# Patient Record
Sex: Female | Born: 1970 | Race: White | Hispanic: No | Marital: Married | Smoking: Never smoker
Health system: Southern US, Community
[De-identification: ages and names within clinical notes are randomized; demographics above are authoritative.]

## PROBLEM LIST (undated history)

## (undated) DIAGNOSIS — H269 Unspecified cataract: Secondary | ICD-10-CM

## (undated) DIAGNOSIS — E079 Disorder of thyroid, unspecified: Secondary | ICD-10-CM

## (undated) HISTORY — DX: Unspecified cataract: H26.9

## (undated) HISTORY — PX: VEIN SURGERY: SHX48

## (undated) HISTORY — DX: Disorder of thyroid, unspecified: E07.9

---

## 1989-01-09 HISTORY — PX: HYMENECTOMY: SHX987

## 1999-06-28 ENCOUNTER — Other Ambulatory Visit: Admission: RE | Admit: 1999-06-28 | Discharge: 1999-06-28 | Payer: Self-pay | Admitting: Gynecology

## 2001-05-27 ENCOUNTER — Other Ambulatory Visit: Admission: RE | Admit: 2001-05-27 | Discharge: 2001-05-27 | Payer: Self-pay | Admitting: Gynecology

## 2002-04-15 ENCOUNTER — Other Ambulatory Visit: Admission: RE | Admit: 2002-04-15 | Discharge: 2002-04-15 | Payer: Self-pay | Admitting: Gynecology

## 2002-11-11 ENCOUNTER — Encounter (INDEPENDENT_AMBULATORY_CARE_PROVIDER_SITE_OTHER): Payer: Self-pay

## 2002-11-11 ENCOUNTER — Inpatient Hospital Stay (HOSPITAL_COMMUNITY): Admission: AD | Admit: 2002-11-11 | Discharge: 2002-11-13 | Payer: Self-pay | Admitting: Gynecology

## 2002-12-30 ENCOUNTER — Other Ambulatory Visit: Admission: RE | Admit: 2002-12-30 | Discharge: 2002-12-30 | Payer: Self-pay | Admitting: Gynecology

## 2005-05-01 ENCOUNTER — Other Ambulatory Visit: Admission: RE | Admit: 2005-05-01 | Discharge: 2005-05-01 | Payer: Self-pay | Admitting: Gynecology

## 2005-11-24 ENCOUNTER — Inpatient Hospital Stay (HOSPITAL_COMMUNITY): Admission: RE | Admit: 2005-11-24 | Discharge: 2005-11-26 | Payer: Self-pay | Admitting: Gynecology

## 2005-12-25 ENCOUNTER — Other Ambulatory Visit: Admission: RE | Admit: 2005-12-25 | Discharge: 2005-12-25 | Payer: Self-pay | Admitting: Gynecology

## 2007-11-08 ENCOUNTER — Inpatient Hospital Stay (HOSPITAL_COMMUNITY): Admission: RE | Admit: 2007-11-08 | Discharge: 2007-11-10 | Payer: Self-pay | Admitting: Obstetrics and Gynecology

## 2007-11-08 ENCOUNTER — Encounter (INDEPENDENT_AMBULATORY_CARE_PROVIDER_SITE_OTHER): Payer: Self-pay | Admitting: Obstetrics and Gynecology

## 2010-03-15 ENCOUNTER — Encounter: Payer: Self-pay | Admitting: Gynecology

## 2010-03-21 ENCOUNTER — Other Ambulatory Visit: Payer: Self-pay | Admitting: Gynecology

## 2010-03-21 ENCOUNTER — Other Ambulatory Visit (HOSPITAL_COMMUNITY)
Admission: RE | Admit: 2010-03-21 | Discharge: 2010-03-21 | Disposition: A | Discharge status: Home / Self Care | Payer: BC Managed Care – PPO | Source: Ambulatory Visit | Attending: Gynecology | Admitting: Gynecology

## 2010-03-21 ENCOUNTER — Encounter (INDEPENDENT_AMBULATORY_CARE_PROVIDER_SITE_OTHER): Payer: BC Managed Care – PPO | Admitting: Gynecology

## 2010-03-21 DIAGNOSIS — Z01419 Encounter for gynecological examination (general) (routine) without abnormal findings: Secondary | ICD-10-CM

## 2010-03-21 DIAGNOSIS — Z833 Family history of diabetes mellitus: Secondary | ICD-10-CM

## 2010-03-21 DIAGNOSIS — Z13220 Encounter for screening for lipoid disorders: Secondary | ICD-10-CM

## 2010-03-21 DIAGNOSIS — N841 Polyp of cervix uteri: Secondary | ICD-10-CM

## 2010-03-21 DIAGNOSIS — Z124 Encounter for screening for malignant neoplasm of cervix: Secondary | ICD-10-CM | POA: Insufficient documentation

## 2010-05-23 ENCOUNTER — Other Ambulatory Visit: Payer: Self-pay | Admitting: Gynecology

## 2010-05-23 DIAGNOSIS — Z1231 Encounter for screening mammogram for malignant neoplasm of breast: Secondary | ICD-10-CM

## 2010-05-24 NOTE — Discharge Summary (Signed)
NAMEENSLEY, BLAS             ACCOUNT NO.:  [id number]   MEDICAL RECORD NO.:  [id number]          PATIENT TYPE:  INP   LOCATION:  9144                          FACILITY:  [facility]   PHYSICIAN:  Malachi Pro. Ambrose Mantle, M.D. DATE OF BIRTH:  Jun 01, 1970   DATE OF ADMISSION:  11/08/2007  DATE OF DISCHARGE:  11/10/2007                               DISCHARGE SUMMARY   A 40 year old white married female para 3-0-0-3, gravida 4, EDC November 12, 2007, admitted for induction of labor.  Blood group and type A  negative, negative antibody, nonreactive serology, rubella immune,  hepatitis B surface antigen negative, HIV negative, GC and chlamydia  negative.  Group B strep negative, 1-hour Glucola 137, 3-hour GTT 88,  141, 112, and 96.  Declined screening.  Ultrasound on April 02, 2007, 8  weeks 0 days, [facility] November 12, 2007.  Ultrasound June 11, 2007, average  gestational age [redacted] weeks 6 days, [facility] November 13, 2007.  Prenatal care  uncomplicated until size less than dates on October 24, 2007.  Ultrasound average gestational age amniotic fluid volume 15th  percentile.  Nonstress test 2 times a week were reactive.   PAST MEDICAL HISTORY:   ALLERGY:  To CIPRO, caused tingling.   OPERATIONS:  Hymenectomy at age 41.   ILLNESSES:  None.   FAMILY HISTORY:  Family history of Hurler syndrome and Sanfilippo type  3.  She declined testing.  Paternal grandfather with heart disease and  diabetes.  Alcohol, tobacco, and drugs none.   OBSTETRICAL HISTORY:  In 1993, a 7 pounds 5 ounces female at 39 weeks;  2004, 8 pounds 3 ounces female; 2007, 7 pounds 14 ounces female.   PHYSICAL EXAMINATION:  VITAL SIGNS:  On admission the patient's vital  signs were normal.  HEART/LUNGS:  Normal.  ABDOMEN:  Soft, fundal height 35-1/2 cm.  Fetal heart tones normal.  Cervix 3-4 cm, 70% vertex minus 2 to minus 3.  Artificial rupture of  membranes produced clear fluid.   HOSPITAL COURSE:  Pitocin was used.  At 9:25 a.m. the  contractions were  every 2-3 minutes.  Cervix was 6 cm, 100%.  She progressed to full  dilatation and pushed well, but her epidural was ineffective with a  large amount of caput showing.  She was screaming out in severe pain, so  a midline episiotomy was made under local block and the 8 pounds 0  ounces female infant with Apgars of 8 at 1 and 9 at 5 minutes, slit up  easily.  The placenta delivered intact.  The uterus was normal.  Second-  degree midline episiotomy repaired under local block.  Blood loss about  400 mL.  Postpartum, the patient did quite well and was discharged on  the second postpartum day.  Initial hemoglobin 10/8, hematocrit 32,  white count 5900, platelet count 262,000.  Follow up hemoglobin 9.8, RPR  nonreactive.  The baby was Rh negative.  Mother was not a candidate for  RhoGAM.   FINAL DIAGNOSES:  Intrauterine pregnancy at 39+ weeks, delivered vertex.   OPERATIONS:  1. Spontaneous delivery vertex.  2. Repair  of second-degree midline episiotomy.   FINAL CONDITION:  Improved.   DISCHARGE INSTRUCTIONS:  Include a regular discharge instruction  booklet.  Percocet 3/325, 30 tablets one every 4-6 hours as needed for  pain and Motrin 600 mg 30 tablets one every 6 hours as needed for pain.  The patient is advised to return to the office in 6 weeks for follow up  examination.  She is also to take ferrous sulfate 325 mg twice daily.      Malachi Pro. Ambrose Mantle, M.D.  Electronically Signed     TFH/MEDQ  D:  11/10/2007  T:  11/10/2007  Job:  [id number]

## 2010-05-27 NOTE — H&P (Signed)
   NAMEZOEANN, [name]                         ACCOUNT NO.:  [id number]   MEDICAL RECORD NO.:  [id number]                   PATIENT TYPE:  INP   LOCATION:  9168                                 [facility]ACILITY:  [facility]   PHYSICIAN:  Ana P. Fontaine, M.D.           DATE OF BIRTH:  05-09-1970   DATE OF ADMISSION:  11/11/2002  DATE OF DISCHARGE:                                HISTORY & PHYSICAL   CHIEF COMPLAINT:  Pregnancy at term, favorable cervix for induction.   HISTORY OF PRESENT ILLNESS:  A 40 year old G45, P74 female at 22-weeks  gestation with a favorable cervix for elective induction.  Options for  expectant management versus induction were discussed, and she wants to  proceed with induction.   PRENATAL COURSE:  Significant for Rh negative.  Her husband is also Rh  negative.  A maternal history of Hurler's, Sanfilippo syndrome in the family  with a prenatal diagnoses declined and a beta strep negative screen.   For remainder of her history see her Hollister.   EXAM:  HEENT:  Normal.  LUNGS:  Clear.  CARDIAC:  Regular rate.  No rubs, murmurs or gallops.  ABDOMEN:  Gravid, vertex fetus.  External monitor shows a reactive fetal  tracing.  PELVIC:  Cervix 2-cm, 50% effaced, -1 to -2 station.   ASSESSMENT/PLAN:  A 40 year old gravida 2, para 1, female at 40 weeks,  favorable cervix, increasing musculoskeletal discomfort for induction.   We will plan on AROM, Pitocin induction.  She is beta strep negative.  The  plan was discussed with the patient, understood and accepted.                                               [name], M.D.    TPF/MEDQ  D:  11/11/2002  T:  11/11/2002  Job:  [id number]

## 2010-05-27 NOTE — H&P (Signed)
[name], [name]             ACCOUNT NO.:  [id number]   MEDICAL RECORD NO.:  [id number]          PATIENT TYPE:  MAT   LOCATION:  MATC                          [facility]ILITY:  [facility]   PHYSICIAN:  Timothy P. Fontaine, M.D.DATE OF BIRTH:  Jan 13, 1970   DATE OF ADMISSION:  11/24/2005  DATE OF DISCHARGE:                                HISTORY & PHYSICAL   CHIEF COMPLAINT:  Post date pregnancy.   HISTORY OF PRESENT ILLNESS:  A 40 year old G 3, P 2 female at [redacted] weeks  gestation admitted for induction due to post date pregnancy, favorable  cervix.  For remainder of her history, see her Hollister.  Prenatal course  has been uncomplicated to date.  She is O negative although her husband is  also Rh negative and her beta Strep screening culture was negative.   PHYSICAL EXAMINATION:  HEENT:  Normal.  LUNGS:  Clear.  CARDIAC:  Regular rate without rubs, murmurs or gallops.  ABDOMEN:  Gravid, vertex.  Fetus consistent with term.  Positive fetal heart  tones.  PELVIC:  Cervix is 2 cm, 50% effaced, 0 station.   ASSESSMENT/PLAN:  A 40 year old G 3, P 2 female [redacted] weeks gestation,  favorable cervix for induction.  The options of continued expectant  management versus induction were reviewed.  Due to the increasing risk of  stillbirth, the patient wants to proceed with induction.  She is beta Strep  negative.  Will plan on Pitocin induction with artificial rupture of  membranes as discussed, understood and accepted.      Timothy P. Fontaine, M.D.  Electronically Signed     TPF/MEDQ  D:  11/23/2005  T:  11/23/2005  Job:  [id number]

## 2010-05-27 NOTE — Discharge Summary (Signed)
NAMEAERIN, [name]                         ACCOUNT NO.:  [id number]   MEDICAL RECORD NO.:  [id number]                   PATIENT TYPE:  INP   LOCATION:  9121                                 [facility]   PHYSICIAN:  Ivor Costa. Farrel Gobble, M.D.              DATE OF BIRTH:  [birth year]   DATE OF ADMISSION:  11/11/2002  DATE OF DISCHARGE:  11/13/2002                                 DISCHARGE SUMMARY   DISCHARGE DIAGNOSES:  1. Intrauterine pregnancy at 40 weeks, delivered,  2. Status post spontaneous vaginal delivery.  3. Rh negative, but husband Rh negative.   HISTORY:  A 40 year old female gravida 2, para 1 with an EDC of November 10, 2002.  Prenatal course had been complicated by a history of Rh negative but  the patient also Rh negative.  There is family history of Hurlers and  Sanfilippo syndromes but the patient declined any prenatal workup and  declined referral for genetic counseling.   HOSPITAL COURSE:  On November 11, 2002 the patient was admitted at 40+ weeks  for induction, begun on Pitocin successfully on November 11, 2002 at 1140 the  patient underwent a spontaneous vaginal delivery of a female, Apgars of 9  and 9, weight of 8 pounds and 3 ounces.  There was a midline episiotomy  which was repaired.  There was a right clitoral tear which was also  repaired.  Postpartum the patient remained afebrile, voiding, in stable  condition.  She was discharged to home on November 13, 2002 and given  [facility] Gyne[facility] postpartum instructions.   Followup on the labs:  The patient is A negative, rubella immune.  The baby  Rh negative.   DISPOSITION:  The patient was discharged to home.  Given a prescription for  Tylox p.r.n. pain. If she has any problems prior to six weeks' visit is to  be seen in the office.     Susa Loffler, P.A.                    Ivor Costa. Farrel Gobble, M.D.    TSG/MEDQ  D:  12/08/2002  T:  12/08/2002  Job:  [id number]

## 2010-06-21 ENCOUNTER — Ambulatory Visit (HOSPITAL_COMMUNITY)
Admission: RE | Admit: 2010-06-21 | Discharge: 2010-06-21 | Disposition: A | Discharge status: Home / Self Care | Payer: BC Managed Care – PPO | Source: Ambulatory Visit | Attending: Gynecology | Admitting: Gynecology

## 2010-06-21 DIAGNOSIS — Z1231 Encounter for screening mammogram for malignant neoplasm of breast: Secondary | ICD-10-CM

## 2010-06-22 ENCOUNTER — Other Ambulatory Visit: Payer: Self-pay | Admitting: Gynecology

## 2010-06-22 DIAGNOSIS — R928 Other abnormal and inconclusive findings on diagnostic imaging of breast: Secondary | ICD-10-CM

## 2010-06-29 ENCOUNTER — Ambulatory Visit
Admission: RE | Admit: 2010-06-29 | Discharge: 2010-06-29 | Disposition: A | Discharge status: Home / Self Care | Payer: BC Managed Care – PPO | Source: Ambulatory Visit | Attending: Gynecology | Admitting: Gynecology

## 2010-06-29 DIAGNOSIS — R928 Other abnormal and inconclusive findings on diagnostic imaging of breast: Secondary | ICD-10-CM

## 2010-10-10 LAB — CBC
HCT: 29.4 — ABNORMAL LOW
Hemoglobin: 10.8 — ABNORMAL LOW
Hemoglobin: 9.8 — ABNORMAL LOW
MCHC: 32.8
MCV: 86.1
Platelets: 241
RBC: 3.79 — ABNORMAL LOW
WBC: 5.9
WBC: 8

## 2010-10-10 LAB — RPR: RPR Ser Ql: NONREACTIVE

## 2011-04-04 ENCOUNTER — Other Ambulatory Visit: Payer: Self-pay | Admitting: Dermatology

## 2011-06-27 ENCOUNTER — Encounter: Payer: Self-pay | Admitting: *Deleted

## 2011-07-04 ENCOUNTER — Encounter: Payer: Self-pay | Admitting: Gynecology

## 2011-07-04 ENCOUNTER — Ambulatory Visit (INDEPENDENT_AMBULATORY_CARE_PROVIDER_SITE_OTHER): Payer: BC Managed Care – PPO | Admitting: Gynecology

## 2011-07-04 VITALS — BP 112/70 | Ht 66.0 in | Wt 142.0 lb

## 2011-07-04 DIAGNOSIS — Z01419 Encounter for gynecological examination (general) (routine) without abnormal findings: Secondary | ICD-10-CM

## 2011-07-04 NOTE — Progress Notes (Addendum)
[name] 01/12/1970 [id number]        41 y.o.  for annual exam.  Overall doing well.  Past medical history,surgical history, medications, allergies, family history and social history were all reviewed and documented in the EPIC chart. ROS:  Was performed and pertinent positives and negatives are included in the history.  Exam: [name] assistant present Filed Vitals:   07/04/11 1015  BP: 112/70   General appearance  Normal Skin grossly normal Lower extremities without swelling, gross varicosities, good peripheral pulses Head/Neck normal with no cervical or supraclavicular adenopathy thyroid normal Lungs  clear Cardiac RR, without RMG Abdominal  soft, nontender, without masses, organomegaly or hernia Breasts  examined lying and sitting without masses, retractions, discharge or axillary adenopathy.  Bilateral inverted nipples noted. Pelvic  Ext/BUS/vagina  normal   Cervix  normal  Uterus  anteverted, normal size, shape and contour, midline and mobile nontender   Adnexa  Without masses or tenderness    Anus and perineum  normal   Rectovaginal  normal sphincter tone without palpated masses or tenderness.    Assessment/Plan:  41 y.o. female for annual exam, regular menses, vasectomy birth control.    1. Mammography. Patient is due for her mammogram now I reminded her to schedule this. Bilateral inverted nipples that she is always had. Occasional galactorrhea which she is always had since breast-feeding her child 3 years ago. Never bloody. Patient will follow if increases and or does other than milk he she knows to report this. SBE monthly reviewed. 2. Pap smear. Last Pap smear 2012. No Pap smear done today. No history of abnormal Pap smears before. Numerous normal records in her chart. We'll plan every 3-5 year screening. 3. Left leg varicosity. Patient was seen for superficial spider vein treatments and was found to have a deeper varicosity. She is having some leg discomfort with  throbbing particularly with standing and is considering to have this addressed. I certainly think this is reasonable and will provide a supporting letter. Her exam is overall normal with good peripheral pulses and no gross evidence of swelling/for costs of these. 4. Health maintenance. Glucose, lipid profile, CBC, urinalysis all normal last year and this was not repeated. Assuming she continues well from a gynecologic standpoint she will see me in a year, sooner as needed.    Dara Lords MD, 10:47 AM 07/04/2011

## 2011-07-04 NOTE — Patient Instructions (Signed)
Follow up in one year for annual gynecologic exam. 

## 2011-08-10 ENCOUNTER — Other Ambulatory Visit: Payer: Self-pay | Admitting: Gynecology

## 2011-08-10 DIAGNOSIS — Z1231 Encounter for screening mammogram for malignant neoplasm of breast: Secondary | ICD-10-CM

## 2011-09-15 ENCOUNTER — Ambulatory Visit (HOSPITAL_COMMUNITY)
Admission: RE | Admit: 2011-09-15 | Discharge: 2011-09-15 | Disposition: A | Discharge status: Home / Self Care | Payer: BC Managed Care – PPO | Source: Ambulatory Visit | Attending: Gynecology | Admitting: Gynecology

## 2011-09-15 DIAGNOSIS — Z1231 Encounter for screening mammogram for malignant neoplasm of breast: Secondary | ICD-10-CM

## 2012-05-13 ENCOUNTER — Ambulatory Visit: Payer: Self-pay | Admitting: Women's Health

## 2012-06-24 ENCOUNTER — Encounter: Payer: Self-pay | Admitting: Gynecology

## 2012-11-08 ENCOUNTER — Other Ambulatory Visit: Payer: Self-pay | Admitting: Gynecology

## 2012-11-08 DIAGNOSIS — Z1231 Encounter for screening mammogram for malignant neoplasm of breast: Secondary | ICD-10-CM

## 2012-11-26 ENCOUNTER — Ambulatory Visit (HOSPITAL_COMMUNITY)
Admission: RE | Admit: 2012-11-26 | Discharge: 2012-11-26 | Disposition: A | Discharge status: Home / Self Care | Payer: 59 | Source: Ambulatory Visit | Attending: Gynecology | Admitting: Gynecology

## 2012-11-26 ENCOUNTER — Other Ambulatory Visit: Payer: Self-pay | Admitting: Gynecology

## 2012-11-26 DIAGNOSIS — Z1231 Encounter for screening mammogram for malignant neoplasm of breast: Secondary | ICD-10-CM

## 2012-12-11 ENCOUNTER — Encounter: Payer: Self-pay | Admitting: Gynecology

## 2013-01-21 ENCOUNTER — Encounter: Payer: Self-pay | Admitting: Gynecology

## 2013-01-21 ENCOUNTER — Other Ambulatory Visit (HOSPITAL_COMMUNITY)
Admission: RE | Admit: 2013-01-21 | Discharge: 2013-01-21 | Disposition: A | Discharge status: Home / Self Care | Payer: 59 | Source: Ambulatory Visit | Attending: Gynecology | Admitting: Gynecology

## 2013-01-21 ENCOUNTER — Ambulatory Visit (INDEPENDENT_AMBULATORY_CARE_PROVIDER_SITE_OTHER): Payer: 59 | Admitting: Gynecology

## 2013-01-21 VITALS — BP 116/70 | Ht 66.0 in | Wt 145.0 lb

## 2013-01-21 DIAGNOSIS — Z01419 Encounter for gynecological examination (general) (routine) without abnormal findings: Secondary | ICD-10-CM

## 2013-01-21 DIAGNOSIS — Z1151 Encounter for screening for human papillomavirus (HPV): Secondary | ICD-10-CM | POA: Insufficient documentation

## 2013-01-21 DIAGNOSIS — N9089 Other specified noninflammatory disorders of vulva and perineum: Secondary | ICD-10-CM

## 2013-01-21 LAB — WET PREP FOR TRICH, YEAST, CLUE
CLUE CELLS WET PREP: NONE SEEN
TRICH WET PREP: NONE SEEN
WBC, Wet Prep HPF POC: NONE SEEN

## 2013-01-21 MED ORDER — FLUCONAZOLE 150 MG PO TABS: 150.0000 mg | ORAL_TABLET | Freq: Once | ORAL | Status: DC

## 2013-01-21 NOTE — Progress Notes (Signed)
Ana FanningJulie [name] Aug 06, 1970 [id number]        43 y.o.  G4P4 for annual exam.  Doing well. Several issues noted below.  Past medical history,surgical history, problem list, medications, allergies, family history and social history were all reviewed and documented in the EPIC chart.  ROS:  Performed and pertinent positives and negatives are included in the history, assessment and plan .  Exam: Kim assistant Filed Vitals:   01/21/13 1152  BP: 116/70  Height: 5\' 6"  (1.676 m)  Weight: 145 lb (65.772 kg)   General appearance  Normal Skin grossly normal Head/Neck normal with no cervical or supraclavicular adenopathy thyroid normal Lungs  clear Cardiac RR, without RMG Abdominal  soft, nontender, without masses, organomegaly or hernia Breasts  examined lying and sitting without masses, retractions, discharge or axillary adenopathy. Bilateral inverted nipples. Pelvic  Ext/BUS/vagina  Normal  Cervix  Normal Pap/HPV done  Uterus  anteverted, normal size, shape and contour, midline and mobile nontender   Adnexa  Without masses or tenderness    Anus and perineum  Normal   Rectovaginal  Normal sphincter tone without palpated masses or tenderness.    Assessment/Plan:  43 y.o. 714P4 female for annual exam regular menses, vasectomy birth control.   1. Vulvar irritation. History of vulvar irritation. She used OTC antifungal and notes that her symptoms cleared. She asked us to be checked to make sure it is okay. Her exam is normal but she did show few yeast on wet prep. Issues of colonization versus infection reviewed. Will cover with Diflucan 150 mg x1 dose. Followup if symptoms recur. 2. Pap smear 2012. Pap/HPV done today. No history of significant abnormal Pap smears previously. 3. Mammography 11/2012. Continue with annual mammography. SBE monthly reviewed. Bilateral inverted nipples as always. Occasional clear galactorrhea is birth of last child. Never bloody. She will monitor and report any  changes. 4. Health maintenance. Patient reports routine blood work in June 2014. No blood work done today. Followup in one year, sooner as needed.   Note: This document was prepared with digital dictation and possible smart phrase technology. Any transcriptional errors that result from this process are unintentional.   Dara LordsFONTAINE,TIMOTHY P MD, 12:28 PM 01/21/2013

## 2013-01-21 NOTE — Patient Instructions (Signed)
Take Diflucan pill once. Followup in one year for annual exam.

## 2013-01-21 NOTE — Addendum Note (Signed)
Addended by: Dayna BarkerGARDNER, Haidyn Chadderdon K on: 01/21/2013 12:39 PM   Modules accepted: Orders

## 2013-01-22 LAB — URINALYSIS W MICROSCOPIC + REFLEX CULTURE
Bacteria, UA: NONE SEEN
Bilirubin Urine: NEGATIVE
Casts: NONE SEEN
Crystals: NONE SEEN
Glucose, UA: NEGATIVE mg/dL
Hgb urine dipstick: NEGATIVE
Ketones, ur: NEGATIVE mg/dL
LEUKOCYTES UA: NEGATIVE
NITRITE: NEGATIVE
PROTEIN: NEGATIVE mg/dL
SQUAMOUS EPITHELIAL / LPF: NONE SEEN
Specific Gravity, Urine: 1.012 (ref 1.005–1.030)
UROBILINOGEN UA: 0.2 mg/dL (ref 0.0–1.0)
pH: 7 (ref 5.0–8.0)

## 2013-03-25 ENCOUNTER — Other Ambulatory Visit: Payer: Self-pay | Admitting: Dermatology

## 2013-04-22 ENCOUNTER — Other Ambulatory Visit: Payer: Self-pay | Admitting: Dermatology

## 2013-05-29 ENCOUNTER — Ambulatory Visit (INDEPENDENT_AMBULATORY_CARE_PROVIDER_SITE_OTHER): Payer: 59 | Admitting: Women's Health

## 2013-05-29 ENCOUNTER — Encounter: Payer: Self-pay | Admitting: Women's Health

## 2013-05-29 DIAGNOSIS — B3731 Acute candidiasis of vulva and vagina: Secondary | ICD-10-CM

## 2013-05-29 DIAGNOSIS — N898 Other specified noninflammatory disorders of vagina: Secondary | ICD-10-CM

## 2013-05-29 DIAGNOSIS — N899 Noninflammatory disorder of vagina, unspecified: Secondary | ICD-10-CM

## 2013-05-29 DIAGNOSIS — B373 Candidiasis of vulva and vagina: Secondary | ICD-10-CM

## 2013-05-29 LAB — WET PREP FOR TRICH, YEAST, CLUE
Clue Cells Wet Prep HPF POC: NONE SEEN
TRICH WET PREP: NONE SEEN

## 2013-05-29 MED ORDER — FLUCONAZOLE 100 MG PO TABS: ORAL_TABLET | ORAL | Status: DC

## 2013-05-29 NOTE — Progress Notes (Signed)
Patient ID: [name], female   DOB: 1970-05-25, 43 y.o.   MRN: [id number] Presents with complaint of vaginal discomfort, irritation off-and-on for 6 months. Symptoms more pronounced after intercourse. Has had problems with yeast like symptoms since October after viral illness.  Minimal relief with over-the-counter Monistat. Last Diflucan was taken in January, had some relief. Denies urinary symptoms, abdominal pain or fever.  Regular monthly cycles/vasectomy.   Exam: Appears well. External genitalia extremely erythematous at introitus, speculum exam moderate amount of a curdy white discharge noted, wet prep positive for numerous yeast.  Yeast vaginitis  Plan: Diflucan 100 2 tablets daily for 3 days and then weekly for several weeks until symptoms subside. Instructed to call if no relief of symptoms. Yeast prevention discussed.

## 2013-05-29 NOTE — Patient Instructions (Signed)
Monilial Vaginitis  Vaginitis in a soreness, swelling and redness (inflammation) of the vagina and vulva. Monilial vaginitis is not a sexually transmitted infection.  CAUSES   Yeast vaginitis is caused by yeast (candida) that is normally found in your vagina. With a yeast infection, the candida has overgrown in number to a point that upsets the chemical balance.  SYMPTOMS   · White, thick vaginal discharge.  · Swelling, itching, redness and irritation of the vagina and possibly the lips of the vagina (vulva).  · Burning or painful urination.  · Painful intercourse.  DIAGNOSIS   Things that may contribute to monilial vaginitis are:  · Postmenopausal and virginal states.  · Pregnancy.  · Infections.  · Being tired, sick or stressed, especially if you had monilial vaginitis in the past.  · Diabetes. Good control will help lower the chance.  · Birth control pills.  · Tight fitting garments.  · Using bubble bath, feminine sprays, douches or deodorant tampons.  · Taking certain medications that kill germs (antibiotics).  · Sporadic recurrence can occur if you become ill.  TREATMENT   Your caregiver will give you medication.  · There are several kinds of anti monilial vaginal creams and suppositories specific for monilial vaginitis. For recurrent yeast infections, use a suppository or cream in the vagina 2 times a week, or as directed.  · Anti-monilial or steroid cream for the itching or irritation of the vulva may also be used. Get your caregiver's permission.  · Painting the vagina with methylene blue solution may help if the monilial cream does not work.  · Eating yogurt may help prevent monilial vaginitis.  HOME CARE INSTRUCTIONS   · Finish all medication as prescribed.  · Do not have sex until treatment is completed or after your caregiver tells you it is okay.  · Take warm sitz baths.  · Do not douche.  · Do not use tampons, especially scented ones.  · Wear cotton underwear.  · Avoid tight pants and panty  hose.  · Tell your sexual partner that you have a yeast infection. They should go to their caregiver if they have symptoms such as mild rash or itching.  · Your sexual partner should be treated as well if your infection is difficult to eliminate.  · Practice safer sex. Use condoms.  · Some vaginal medications cause latex condoms to fail. Vaginal medications that harm condoms are:  · Cleocin cream.  · Butoconazole (Femstat®).  · Terconazole (Terazol®) vaginal suppository.  · Miconazole (Monistat®) (may be purchased over the counter).  SEEK MEDICAL CARE IF:   · You have a temperature by mouth above 102° F (38.9° C).  · The infection is getting worse after 2 days of treatment.  · The infection is not getting better after 3 days of treatment.  · You develop blisters in or around your vagina.  · You develop vaginal bleeding, and it is not your menstrual period.  · You have pain when you urinate.  · You develop intestinal problems.  · You have pain with sexual intercourse.  Document Released: 10/05/2004 Document Revised: 03/20/2011 Document Reviewed: 06/19/2008  ExitCare® Patient Information ©2014 [employer].

## 2013-11-10 ENCOUNTER — Encounter: Payer: Self-pay | Admitting: Women's Health

## 2013-11-21 ENCOUNTER — Other Ambulatory Visit: Payer: Self-pay | Admitting: Gynecology

## 2013-11-21 DIAGNOSIS — Z1231 Encounter for screening mammogram for malignant neoplasm of breast: Secondary | ICD-10-CM

## 2013-11-27 ENCOUNTER — Ambulatory Visit (HOSPITAL_COMMUNITY)
Admission: RE | Admit: 2013-11-27 | Discharge: 2013-11-27 | Disposition: A | Discharge status: Home / Self Care | Payer: 59 | Source: Ambulatory Visit | Attending: Gynecology | Admitting: Gynecology

## 2013-11-27 DIAGNOSIS — Z1231 Encounter for screening mammogram for malignant neoplasm of breast: Secondary | ICD-10-CM

## 2014-08-25 ENCOUNTER — Encounter: Payer: Self-pay | Admitting: Gynecology

## 2014-08-25 ENCOUNTER — Ambulatory Visit (INDEPENDENT_AMBULATORY_CARE_PROVIDER_SITE_OTHER): Payer: 59 | Admitting: Gynecology

## 2014-08-25 VITALS — BP 110/70 | Ht 66.0 in | Wt 149.0 lb

## 2014-08-25 DIAGNOSIS — Z01419 Encounter for gynecological examination (general) (routine) without abnormal findings: Secondary | ICD-10-CM

## 2014-08-25 NOTE — Patient Instructions (Signed)

## 2014-08-25 NOTE — Progress Notes (Signed)
[name] 09-Oct-1970 [id number]        44 y.o.  G4P4 for annual exam.  Doing well.  Past medical history,surgical history, problem list, medications, allergies, family history and social history were all reviewed and documented as reviewed in the EPIC chart.  ROS:  Performed with pertinent positives and negatives included in the history, assessment and plan.   Additional significant findings :  none   Exam: [facility] person Vitals:   08/25/14 1512  BP: 110/70  Height:  (1.676 m)  Weight: 149 lb (67.586 kg)   General appearance:  Normal affect, orientation and appearance. Skin: Grossly normal HEENT: Without gross lesions.  No cervical or supraclavicular adenopathy. Thyroid normal.  Lungs:  Clear without wheezing, rales or rhonchi Cardiac: RR, without RMG Abdominal:  Soft, nontender, without masses, guarding, rebound, organomegaly or hernia Breasts:  Examined lying and sitting without masses, retractions, discharge or axillary adenopathy.  Bilateral inverted nipples noted as always. Pelvic:  Ext/BUS/vagina normal  Cervix normal  Uterus anteverted, normal size, shape and contour, midline and mobile nontender   Adnexa  Without masses or tenderness    Anus and perineum  Normal   Rectovaginal  Normal sphincter tone without palpated masses or tenderness.    Assessment/Plan:  44 y.o. G7P4 female for annual exam with regular menses, vasectomy birth control.   1. Pap smear/HPV negative 2015. No Pap smear done today.  No history of abnormal Pap smears previously. Plan repeat Pap smear at 3-5 year interval. 2. Mammography 11/2013. Continue with annual mammography when due. SBE monthly reviewed. 3. Health maintenance. Baseline CBC comprehensive metabolic panel lipid profile urinalysis ordered. Follow up in one year, sooner as needed.   Dara Lords MD, 3:50 PM 08/25/2014

## 2014-08-26 LAB — CBC WITH DIFFERENTIAL/PLATELET
Basophils Absolute: 0.1 10*3/uL (ref 0.0–0.1)
Basophils Relative: 1 % (ref 0–1)
Eosinophils Absolute: 0.1 10*3/uL (ref 0.0–0.7)
Eosinophils Relative: 2 % (ref 0–5)
HEMATOCRIT: 36.7 % (ref 36.0–46.0)
HEMOGLOBIN: 12 g/dL (ref 12.0–15.0)
LYMPHS ABS: 1.5 10*3/uL (ref 0.7–4.0)
Lymphocytes Relative: 25 % (ref 12–46)
MCH: 29 pg (ref 26.0–34.0)
MCHC: 32.7 g/dL (ref 30.0–36.0)
MCV: 88.6 fL (ref 78.0–100.0)
MONO ABS: 0.5 10*3/uL (ref 0.1–1.0)
MONOS PCT: 9 % (ref 3–12)
MPV: 9.8 fL (ref 8.6–12.4)
NEUTROS ABS: 3.8 10*3/uL (ref 1.7–7.7)
NEUTROS PCT: 63 % (ref 43–77)
Platelets: 249 10*3/uL (ref 150–400)
RBC: 4.14 MIL/uL (ref 3.87–5.11)
RDW: 12.9 % (ref 11.5–15.5)
WBC: 6.1 10*3/uL (ref 4.0–10.5)

## 2014-08-26 LAB — URINALYSIS W MICROSCOPIC + REFLEX CULTURE
BACTERIA UA: NONE SEEN [HPF]
BILIRUBIN URINE: NEGATIVE
Casts: NONE SEEN [LPF]
Crystals: NONE SEEN [HPF]
GLUCOSE, UA: NEGATIVE
HGB URINE DIPSTICK: NEGATIVE
Ketones, ur: NEGATIVE
LEUKOCYTES UA: NEGATIVE
Nitrite: NEGATIVE
PROTEIN: NEGATIVE
RBC / HPF: NONE SEEN RBC/HPF (ref <=2)
SQUAMOUS EPITHELIAL / LPF: NONE SEEN [HPF] (ref <=5)
Specific Gravity, Urine: 1.011 (ref 1.001–1.035)
WBC, UA: NONE SEEN WBC/HPF (ref <=5)
YEAST: NONE SEEN [HPF]
pH: 7 (ref 5.0–8.0)

## 2014-08-26 LAB — COMPREHENSIVE METABOLIC PANEL
ALBUMIN: 3.9 g/dL (ref 3.6–5.1)
ALT: 18 U/L (ref 6–29)
AST: 16 U/L (ref 10–30)
Alkaline Phosphatase: 35 U/L (ref 33–115)
BUN: 13 mg/dL (ref 7–25)
CALCIUM: 9 mg/dL (ref 8.6–10.2)
CHLORIDE: 103 mmol/L (ref 98–110)
CO2: 22 mmol/L (ref 20–31)
CREATININE: 0.66 mg/dL (ref 0.50–1.10)
Glucose, Bld: 81 mg/dL (ref 65–99)
POTASSIUM: 4.3 mmol/L (ref 3.5–5.3)
SODIUM: 136 mmol/L (ref 135–146)
TOTAL PROTEIN: 6.3 g/dL (ref 6.1–8.1)
Total Bilirubin: 0.3 mg/dL (ref 0.2–1.2)

## 2014-08-26 LAB — LIPID PANEL
CHOLESTEROL: 137 mg/dL (ref 125–200)
HDL: 53 mg/dL (ref >=46)
LDL Cholesterol: 70 mg/dL (ref <130)
TRIGLYCERIDES: 70 mg/dL (ref <150)
Total CHOL/HDL Ratio: 2.6 Ratio (ref <=5.0)
VLDL: 14 mg/dL (ref <30)

## 2014-12-11 ENCOUNTER — Other Ambulatory Visit: Payer: Self-pay

## 2014-12-11 DIAGNOSIS — Z1231 Encounter for screening mammogram for malignant neoplasm of breast: Secondary | ICD-10-CM

## 2014-12-30 ENCOUNTER — Ambulatory Visit
Admission: RE | Admit: 2014-12-30 | Discharge: 2014-12-30 | Disposition: A | Discharge status: Home / Self Care | Payer: 59 | Source: Ambulatory Visit

## 2014-12-30 DIAGNOSIS — Z1231 Encounter for screening mammogram for malignant neoplasm of breast: Secondary | ICD-10-CM

## 2015-07-07 ENCOUNTER — Ambulatory Visit (INDEPENDENT_AMBULATORY_CARE_PROVIDER_SITE_OTHER): Payer: Managed Care, Other (non HMO) | Admitting: Gynecology

## 2015-07-07 ENCOUNTER — Encounter: Payer: Self-pay | Admitting: Gynecology

## 2015-07-07 VITALS — BP 114/70

## 2015-07-07 DIAGNOSIS — N760 Acute vaginitis: Secondary | ICD-10-CM | POA: Diagnosis not present

## 2015-07-07 LAB — WET PREP FOR TRICH, YEAST, CLUE
Clue Cells Wet Prep HPF POC: NONE SEEN
TRICH WET PREP: NONE SEEN
WBC, Wet Prep HPF POC: NONE SEEN
YEAST WET PREP: NONE SEEN

## 2015-07-07 MED ORDER — FLUCONAZOLE 150 MG PO TABS: 150.0000 mg | ORAL_TABLET | Freq: Every day | ORAL | Status: DC

## 2015-07-07 NOTE — Progress Notes (Signed)
    Ana [name] 1970-05-24 [id number]        [age] y.o.  G4P4  presents complaining of vaginal irritation and discharge in May for which she took some Diflucan she had at home and resolved her symptoms.  She subsequently developed recurrent discharge and irritation earlier in June for which she used OTC antifungal 7 day with some relief but persistence of a low-level irritation. Last week she had spotting  1 day. Otherwise having regular monthly menses without irregular bleeding. No urinary symptoms such as frequency dysuria or urgency.  Past medical history,surgical history, problem list, medications, allergies, family history and social history were all reviewed and documented in the EPIC chart.  Directed ROS with pertinent positives and negatives documented in the history of present illness/assessment and plan.  Exam: Kennon PortelaKim Gardner assistant Filed Vitals:   07/07/15 1143  BP: 114/70   General appearance:  Normal Abdomen soft nontender without masses guarding rebound Pelvic external BUS vagina with scant discharge. Cervix normal. Uterus normal size midline mobile nontender. Adnexa without masses or tenderness.  Assessment/Plan:  45 y.o. G4P4 with history as above. Exam is unremarkable. Wet prep is negative. I'm going to cover her for low-grade yeast with Diflucan 150 mg daily 3 days. Assuming symptoms resolve them will follow expectantly. We'll keep track of her spotting at this is an isolated incident them will monitor. If she has recurrent intermenstrual bleeding that she knows to call for further evaluation.    Dara LordsFONTAINE,Christell Steinmiller P MD, 12:03 PM 07/07/2015

## 2015-07-07 NOTE — Patient Instructions (Addendum)
Take the Diflucan pill daily for 3 days. Follow up if your symptoms persist, worsen or  subsequently

## 2015-07-07 NOTE — Addendum Note (Signed)
Addended by: Dayna BarkerGARDNER, Patryk Conant K on: 07/07/2015 02:24 PM   Modules accepted: Orders

## 2015-08-27 ENCOUNTER — Ambulatory Visit (INDEPENDENT_AMBULATORY_CARE_PROVIDER_SITE_OTHER): Payer: Managed Care, Other (non HMO) | Admitting: Gynecology

## 2015-08-27 ENCOUNTER — Encounter: Payer: Self-pay | Admitting: Gynecology

## 2015-08-27 VITALS — BP 118/70 | Ht 66.0 in | Wt 147.0 lb

## 2015-08-27 DIAGNOSIS — Z01419 Encounter for gynecological examination (general) (routine) without abnormal findings: Secondary | ICD-10-CM

## 2015-08-27 LAB — COMPREHENSIVE METABOLIC PANEL
ALBUMIN: 4.2 g/dL (ref 3.6–5.1)
ALK PHOS: 41 U/L (ref 33–115)
ALT: 25 U/L (ref 6–29)
AST: 23 U/L (ref 10–35)
BILIRUBIN TOTAL: 0.5 mg/dL (ref 0.2–1.2)
BUN: 11 mg/dL (ref 7–25)
CO2: 27 mmol/L (ref 20–31)
CREATININE: 0.76 mg/dL (ref 0.50–1.10)
Calcium: 9.5 mg/dL (ref 8.6–10.2)
Chloride: 104 mmol/L (ref 98–110)
Glucose, Bld: 77 mg/dL (ref 65–99)
Potassium: 4.5 mmol/L (ref 3.5–5.3)
SODIUM: 139 mmol/L (ref 135–146)
TOTAL PROTEIN: 6.6 g/dL (ref 6.1–8.1)

## 2015-08-27 LAB — CBC WITH DIFFERENTIAL/PLATELET
BASOS PCT: 1 %
Basophils Absolute: 60 cells/uL (ref 0–200)
EOS PCT: 1 %
Eosinophils Absolute: 60 cells/uL (ref 15–500)
HEMATOCRIT: 39.5 % (ref 35.0–45.0)
HEMOGLOBIN: 13.3 g/dL (ref 11.7–15.5)
LYMPHS ABS: 1800 {cells}/uL (ref 850–3900)
Lymphocytes Relative: 30 %
MCH: 29.8 pg (ref 27.0–33.0)
MCHC: 33.7 g/dL (ref 32.0–36.0)
MCV: 88.6 fL (ref 80.0–100.0)
MONO ABS: 480 {cells}/uL (ref 200–950)
MPV: 10 fL (ref 7.5–12.5)
Monocytes Relative: 8 %
NEUTROS ABS: 3600 {cells}/uL (ref 1500–7800)
Neutrophils Relative %: 60 %
Platelets: 274 10*3/uL (ref 140–400)
RBC: 4.46 MIL/uL (ref 3.80–5.10)
RDW: 12.9 % (ref 11.0–15.0)
WBC: 6 10*3/uL (ref 3.8–10.8)

## 2015-08-27 NOTE — Patient Instructions (Signed)

## 2015-08-27 NOTE — Progress Notes (Signed)
    Raynelle FanningJulie Angeletti January 15, 1970 [id number]        45 y.o.  G4P4  for annual exam.  Doing well without complaints  Past medical history,surgical history, problem list, medications, allergies, family history and social history were all reviewed and documented as reviewed in the EPIC chart.  ROS:  Performed with pertinent positives and negatives included in the history, assessment and plan.   Additional significant findings :  None   Exam: Kennon PortelaKim Gardner assistant Vitals:   08/27/15 1402  BP: 118/70  Weight: 147 lb (66.7 kg)  Height: 5\' 6"  (1.676 m)   Body mass index is 23.73 kg/m.  General appearance:  Normal affect, orientation and appearance. Skin: Grossly normal HEENT: Without gross lesions.  No cervical or supraclavicular adenopathy. Thyroid normal.  Lungs:  Clear without wheezing, rales or rhonchi Cardiac: RR, without RMG Abdominal:  Soft, nontender, without masses, guarding, rebound, organomegaly or hernia Breasts:  Examined lying and sitting without masses, retractions, discharge or axillary adenopathy. Pelvic:  Ext/BUS/Vagina normal with slight menses flow  Cervix normal  Uterus anteverted, normal size, shape and contour, midline and mobile nontender   Adnexa without masses or tenderness    Anus and perineum normal   Rectovaginal normal sphincter tone without palpated masses or tenderness.    Assessment/Plan:  45 y.o. 74P4 female for annual exam with regular menses, vasectomy birth control.   1. Pap smear/HPV 2015 negative. No Pap smear today. No history of significant abnormal Pap smears previously. Plan repeat Pap smear approaching 5 year interval. 2. Mammography 12/2014. Continue with with annual mammography when due. SBE monthly reviewed. 3. Health maintenance. Baseline CBC, comprehensive mobile panel and urinalysis ordered.  Lipid profile last year great with cholesterol 137 HDL 53 and LDL 70.  Follow up in one year, sooner as needed.   Dara LordsFONTAINE,Ambria Mayfield P MD, 2:17  PM 08/27/2015

## 2015-08-28 LAB — URINALYSIS W MICROSCOPIC + REFLEX CULTURE
BILIRUBIN URINE: NEGATIVE
Bacteria, UA: NONE SEEN [HPF]
CRYSTALS: NONE SEEN [HPF]
Casts: NONE SEEN [LPF]
GLUCOSE, UA: NEGATIVE
Hgb urine dipstick: NEGATIVE
KETONES UR: NEGATIVE
Leukocytes, UA: NEGATIVE
Nitrite: NEGATIVE
PH: 6.5 (ref 5.0–8.0)
Protein, ur: NEGATIVE
RBC / HPF: NONE SEEN RBC/HPF (ref <=2)
SPECIFIC GRAVITY, URINE: 1.013 (ref 1.001–1.035)
Squamous Epithelial / LPF: NONE SEEN [HPF] (ref <=5)
WBC UA: NONE SEEN WBC/HPF (ref <=5)
Yeast: NONE SEEN [HPF]

## 2015-11-08 ENCOUNTER — Encounter (INDEPENDENT_AMBULATORY_CARE_PROVIDER_SITE_OTHER): Payer: Managed Care, Other (non HMO) | Admitting: Ophthalmology

## 2015-11-08 DIAGNOSIS — H4421 Degenerative myopia, right eye: Secondary | ICD-10-CM | POA: Diagnosis not present

## 2015-11-08 DIAGNOSIS — H43813 Vitreous degeneration, bilateral: Secondary | ICD-10-CM

## 2015-11-08 DIAGNOSIS — H33302 Unspecified retinal break, left eye: Secondary | ICD-10-CM | POA: Diagnosis not present

## 2016-02-11 ENCOUNTER — Other Ambulatory Visit: Payer: Self-pay | Admitting: Gynecology

## 2016-02-11 DIAGNOSIS — Z1231 Encounter for screening mammogram for malignant neoplasm of breast: Secondary | ICD-10-CM

## 2016-02-28 ENCOUNTER — Ambulatory Visit: Payer: 59

## 2016-03-06 ENCOUNTER — Ambulatory Visit
Admission: RE | Admit: 2016-03-06 | Discharge: 2016-03-06 | Disposition: A | Discharge status: Home / Self Care | Payer: 59 | Source: Ambulatory Visit | Attending: Gynecology | Admitting: Gynecology

## 2016-03-06 DIAGNOSIS — Z1231 Encounter for screening mammogram for malignant neoplasm of breast: Secondary | ICD-10-CM

## 2016-05-22 ENCOUNTER — Encounter: Payer: Self-pay | Admitting: Gynecology

## 2016-05-22 ENCOUNTER — Ambulatory Visit (INDEPENDENT_AMBULATORY_CARE_PROVIDER_SITE_OTHER): Payer: 59 | Admitting: Gynecology

## 2016-05-22 VITALS — BP 116/74

## 2016-05-22 DIAGNOSIS — N644 Mastodynia: Secondary | ICD-10-CM | POA: Diagnosis not present

## 2016-05-22 NOTE — Patient Instructions (Signed)
Follow up if the left breast discomfort continues

## 2016-05-22 NOTE — Progress Notes (Signed)
    Ana FanningJulie [name] 03/08/1970 [id number]        46 y.o.  G4P4 presents with a one-week history of left breast pain mid upper breast. Had been lying on her couch and then got up and had the pain. Seems to be getting better since she first noticed it. Does have some discomfort in the area when she moves her arm. No palpable masses or nipple discharge. No associated symptoms such as respiratory or cardiac. Recent screening mammogram in February normal  Past medical history,surgical history, problem list, medications, allergies, family history and social history were all reviewed and documented in the EPIC chart.  Directed ROS with pertinent positives and negatives documented in the history of present illness/assessment and plan.  Exam: Ana PortelaKim [name] assistant Vitals:   05/22/16 0915  BP: 116/74   General appearance:  Normal Both breast examined lying and sitting without masses, retractions, discharge or adenopathy. Bilateral inverted nipples is always.  Assessment/Plan:  46 y.o. G4P4 with left breast pain 12:00 position getting better. Exam is normal. Recent screening mammogram negative. Reviewed differential to include musculoskeletal versus breast. As her discomfort is resolving recommended monitoring for now as long as it totally resolves to follow. If becomes recurrent or persists/worsens then follow up for further evaluation. Possible ultrasound/diagnostic mammography discussed. Otherwise follow up when due for annual exam.    Dara LordsFONTAINE,[name] P MD, 9:29 AM 05/22/2016

## 2016-06-28 ENCOUNTER — Encounter: Payer: Self-pay | Admitting: Women's Health

## 2016-06-28 ENCOUNTER — Ambulatory Visit (INDEPENDENT_AMBULATORY_CARE_PROVIDER_SITE_OTHER): Payer: 59 | Admitting: Women's Health

## 2016-06-28 VITALS — BP 115/74

## 2016-06-28 DIAGNOSIS — B3731 Acute candidiasis of vulva and vagina: Secondary | ICD-10-CM

## 2016-06-28 DIAGNOSIS — B9689 Other specified bacterial agents as the cause of diseases classified elsewhere: Secondary | ICD-10-CM | POA: Diagnosis not present

## 2016-06-28 DIAGNOSIS — B373 Candidiasis of vulva and vagina: Secondary | ICD-10-CM | POA: Diagnosis not present

## 2016-06-28 DIAGNOSIS — N760 Acute vaginitis: Secondary | ICD-10-CM

## 2016-06-28 LAB — WET PREP FOR TRICH, YEAST, CLUE
TRICH WET PREP: NONE SEEN
YEAST WET PREP: NONE SEEN

## 2016-06-28 MED ORDER — METRONIDAZOLE 500 MG PO TABS: 500.0000 mg | ORAL_TABLET | Freq: Two times a day (BID) | ORAL | 0 refills | Status: DC

## 2016-06-28 MED ORDER — FLUCONAZOLE 150 MG PO TABS: 150.0000 mg | ORAL_TABLET | Freq: Once | ORAL | 1 refills | Status: AC

## 2016-06-28 NOTE — Addendum Note (Signed)
Addended by: Dayna BarkerGARDNER, Manon Banbury K on: 06/28/2016 02:20 PM   Modules accepted: Orders

## 2016-06-28 NOTE — Progress Notes (Signed)
Presents with complaint of vaginal irritation with itching and discomfort for the past few days. Going on a vacation and wants to be sure no problems. Denies urinary symptoms, abdominal pain, nausea or fever. Same partner, monthly cycles/ vasectomy.  No health problems.  Exam: Appears well. No CVAT. Abdomen soft, nontender no radiation. External genitalia erythematous at introitus, speculum exam scant white discharge, minimal erythema, no odor, wet prep positive for clues, many bacteria. Bimanual no CMT or adnexal tenderness.   Bacteria vaginosis  Plan: Flagyl 500 twice daily for 7 days alcohol precautions reviewed. Diflucan 150 by mouth 1 dose, instructed to call if no relief of symptoms.

## 2016-06-28 NOTE — Patient Instructions (Signed)

## 2017-03-01 ENCOUNTER — Other Ambulatory Visit: Payer: Self-pay | Admitting: Gynecology

## 2017-03-01 DIAGNOSIS — Z1231 Encounter for screening mammogram for malignant neoplasm of breast: Secondary | ICD-10-CM

## 2017-03-19 ENCOUNTER — Ambulatory Visit
Admission: RE | Admit: 2017-03-19 | Discharge: 2017-03-19 | Disposition: A | Discharge status: Home / Self Care | Payer: 59 | Source: Ambulatory Visit | Attending: Gynecology | Admitting: Gynecology

## 2017-03-19 DIAGNOSIS — Z1231 Encounter for screening mammogram for malignant neoplasm of breast: Secondary | ICD-10-CM

## 2018-01-16 ENCOUNTER — Encounter (INDEPENDENT_AMBULATORY_CARE_PROVIDER_SITE_OTHER): Payer: 59 | Admitting: Ophthalmology

## 2018-05-20 ENCOUNTER — Other Ambulatory Visit: Payer: Self-pay

## 2018-05-20 ENCOUNTER — Other Ambulatory Visit: Payer: Self-pay | Admitting: Gynecology

## 2018-05-20 DIAGNOSIS — Z1231 Encounter for screening mammogram for malignant neoplasm of breast: Secondary | ICD-10-CM

## 2018-05-21 ENCOUNTER — Encounter: Payer: Self-pay | Admitting: Gynecology

## 2018-05-21 ENCOUNTER — Ambulatory Visit: Payer: 59 | Admitting: Gynecology

## 2018-05-21 VITALS — BP 116/70

## 2018-05-21 DIAGNOSIS — B3731 Acute candidiasis of vulva and vagina: Secondary | ICD-10-CM

## 2018-05-21 DIAGNOSIS — B373 Candidiasis of vulva and vagina: Secondary | ICD-10-CM

## 2018-05-21 LAB — WET PREP FOR TRICH, YEAST, CLUE

## 2018-05-21 MED ORDER — FLUCONAZOLE 150 MG PO TABS
150.0000 mg | ORAL_TABLET | Freq: Every day | ORAL | 0 refills | Status: DC
Start: 2018-05-21 — End: 2018-11-08

## 2018-05-21 NOTE — Addendum Note (Signed)
Addended by: Dayna Barker on: 05/21/2018 09:52 AM   Modules accepted: Orders

## 2018-05-21 NOTE — Patient Instructions (Signed)
Take the Diflucan pill once today and once tomorrow.  Follow-up if your symptoms persist.  Save the 2 additional pills to have for future use if needed.

## 2018-05-21 NOTE — Progress Notes (Signed)
    [name] Nov 30, 1970 [id number]        48 y.o.  G4P4 with 1 week history of vaginal discharge, irritation and itching.  No odor.  Used OTC antifungal ovule end of last week and notes symptoms seem somewhat better but not totally gone.  No urinary symptoms such as frequency dysuria urgency low back pain fever or chills.  Past medical history,surgical history, problem list, medications, allergies, family history and social history were all reviewed and documented in the EPIC chart.  Directed ROS with pertinent positives and negatives documented in the history of present illness/assessment and plan.  Exam: Kennon Portela assistant Vitals:   05/21/18 0927  BP: 116/70   General appearance:  Normal Abdomen soft nontender without masses guarding rebound Pelvic external BUS vagina with white clumpy discharge.  Cervix normal.  Uterus normal size midline mobile nontender.  Adnexa without masses or tenderness.  Assessment/Plan:  48 y.o. G4P4 history as above.  Wet prep positive for yeast.  Will treat with Diflucan 150 mg daily x2 days.  I gave her an additional 2 pills to have with her as she is traveling to the beach in 2 weeks in the event she would have a recurrence.  Follow-up if symptoms persist.  Patient has her annual exam scheduled for next week and will follow-up for this.    Dara Lords MD, 9:37 AM 05/21/2018

## 2018-05-30 ENCOUNTER — Encounter: Payer: Self-pay | Admitting: Gynecology

## 2018-05-30 ENCOUNTER — Other Ambulatory Visit: Payer: Self-pay

## 2018-05-30 ENCOUNTER — Ambulatory Visit (INDEPENDENT_AMBULATORY_CARE_PROVIDER_SITE_OTHER): Payer: 59 | Admitting: Gynecology

## 2018-05-30 VITALS — BP 116/76 | Ht 66.0 in | Wt 146.0 lb

## 2018-05-30 DIAGNOSIS — Z01419 Encounter for gynecological examination (general) (routine) without abnormal findings: Secondary | ICD-10-CM

## 2018-05-30 DIAGNOSIS — Z1151 Encounter for screening for human papillomavirus (HPV): Secondary | ICD-10-CM | POA: Diagnosis not present

## 2018-05-30 DIAGNOSIS — Z13220 Encounter for screening for lipoid disorders: Secondary | ICD-10-CM

## 2018-05-30 DIAGNOSIS — N841 Polyp of cervix uteri: Secondary | ICD-10-CM

## 2018-05-30 NOTE — Progress Notes (Signed)
    [name] 12-02-1970 [id number]        48 y.o.  G4P4 for annual gynecologic exam.  No gynecologic complaints.  Recently treated for yeast vaginitis with resolution of her symptoms.  Past medical history,surgical history, problem list, medications, allergies, family history and social history were all reviewed and documented as reviewed in the EPIC chart.  ROS:  Performed with pertinent positives and negatives included in the history, assessment and plan.   Additional significant findings : None   Exam: Kennon Portela assistant Vitals:   05/30/18 0833  BP: 116/76  Weight: 146 lb (66.2 kg)  Height: 5\' 6"  (1.676 m)   Body mass index is 23.57 kg/m.  General appearance:  Normal affect, orientation and appearance. Skin: Grossly normal HEENT: Without gross lesions.  No cervical or supraclavicular adenopathy. Thyroid normal.  Lungs:  Clear without wheezing, rales or rhonchi Cardiac: RR, without RMG Abdominal:  Soft, nontender, without masses, guarding, rebound, organomegaly or hernia Breasts:  Examined lying and sitting without masses, retractions, discharge or axillary adenopathy.  Bilateral inverted nipples as always, easily reverts Pelvic:  Ext, BUS, Vagina: Normal  Cervix: With endocervical polyp extruding from the office.  Biopsied off.  Pap smear/HPV performed before hand.  Uterus: Anteverted, normal size, shape and contour, midline and mobile nontender   Adnexa: Without masses or tenderness    Anus and perineum: Normal   Rectovaginal: Normal sphincter tone without palpated masses or tenderness.    Assessment/Plan:  48 y.o. G57P4 female for annual gynecologic exam.  With regular menses, vasectomy birth control  1. Endocervical polyp noted on exam.  Biopsied off and sent to pathology.  Patient tolerated well and will follow-up for the biopsy results. 2. Pap smear/HPV 2015.  Pap smear/HPV today.  No history of significant abnormal Pap smears. 3. Mammography scheduled in  July.  Breast exam normal today. 4. Health maintenance.  Baseline CBC, CMP and lipid profile ordered.  Follow-up 1 year, sooner as needed.   Dara Lords MD, 8:53 AM 05/30/2018

## 2018-05-30 NOTE — Addendum Note (Signed)
Addended by: Dayna Barker on: 05/30/2018 09:06 AM   Modules accepted: Orders

## 2018-05-30 NOTE — Patient Instructions (Signed)
Office will call you with biopsy results 

## 2018-05-31 LAB — COMPREHENSIVE METABOLIC PANEL
AG Ratio: 1.7 (calc) (ref 1.0–2.5)
ALT: 12 U/L (ref 6–29)
AST: 16 U/L (ref 10–35)
Albumin: 4.1 g/dL (ref 3.6–5.1)
Alkaline phosphatase (APISO): 30 U/L — ABNORMAL LOW (ref 31–125)
BUN: 11 mg/dL (ref 7–25)
CO2: 23 mmol/L (ref 20–32)
Calcium: 9 mg/dL (ref 8.6–10.2)
Chloride: 106 mmol/L (ref 98–110)
Creat: 0.68 mg/dL (ref 0.50–1.10)
Globulin: 2.4 g/dL (calc) (ref 1.9–3.7)
Glucose, Bld: 94 mg/dL (ref 65–99)
Potassium: 4 mmol/L (ref 3.5–5.3)
Sodium: 139 mmol/L (ref 135–146)
Total Bilirubin: 0.4 mg/dL (ref 0.2–1.2)
Total Protein: 6.5 g/dL (ref 6.1–8.1)

## 2018-05-31 LAB — CBC WITH DIFFERENTIAL/PLATELET
Absolute Monocytes: 635 cells/uL (ref 200–950)
Basophils Absolute: 80 cells/uL (ref 0–200)
Basophils Relative: 1.7 %
Eosinophils Absolute: 334 cells/uL (ref 15–500)
Eosinophils Relative: 7.1 %
HCT: 39.6 % (ref 35.0–45.0)
Hemoglobin: 13.2 g/dL (ref 11.7–15.5)
Lymphs Abs: 1476 cells/uL (ref 850–3900)
MCH: 30.2 pg (ref 27.0–33.0)
MCHC: 33.3 g/dL (ref 32.0–36.0)
MCV: 90.6 fL (ref 80.0–100.0)
MPV: 10.4 fL (ref 7.5–12.5)
Monocytes Relative: 13.5 %
Neutro Abs: 2176 cells/uL (ref 1500–7800)
Neutrophils Relative %: 46.3 %
Platelets: 246 10*3/uL (ref 140–400)
RBC: 4.37 10*6/uL (ref 3.80–5.10)
RDW: 12.2 % (ref 11.0–15.0)
Total Lymphocyte: 31.4 %
WBC: 4.7 10*3/uL (ref 3.8–10.8)

## 2018-05-31 LAB — LIPID PANEL
Cholesterol: 200 mg/dL — ABNORMAL HIGH (ref <200)
HDL: 59 mg/dL (ref >=50)
LDL Cholesterol (Calc): 126 mg/dL (calc) — ABNORMAL HIGH
Non-HDL Cholesterol (Calc): 141 mg/dL (calc) — ABNORMAL HIGH (ref <130)
Total CHOL/HDL Ratio: 3.4 (calc) (ref <5.0)
Triglycerides: 62 mg/dL (ref <150)

## 2018-06-04 ENCOUNTER — Encounter: Payer: Self-pay | Admitting: Gynecology

## 2018-06-04 LAB — PATHOLOGY REPORT

## 2018-06-04 LAB — PAP IG AND HPV HIGH-RISK: HPV DNA High Risk: NOT DETECTED

## 2018-06-04 LAB — TISSUE SPECIMEN

## 2018-06-18 ENCOUNTER — Telehealth: Payer: Self-pay

## 2018-06-18 NOTE — Telephone Encounter (Signed)
I called patient because emails Dr. Loetta Rough sent her about results were returned unread.  Per DPR access note I read them to her and let her know she can view them in My Chart.  "Your lab results look good with the exception of a borderline cholesterol and LDL (bad component). It may be that this was not a true fasting specimen. Regardless I would follow for now and I would recommend checking a fasting lipid profile sometime later this year. You can arrange to do that through our office by just calling and telling them when you would like to come in and they will place an order for the lab'  And:  "Your Pap smear was normal. The cervical polyp was benign. Nothing further needs to be done."

## 2018-07-10 ENCOUNTER — Other Ambulatory Visit: Payer: Self-pay

## 2018-07-10 ENCOUNTER — Ambulatory Visit
Admission: RE | Admit: 2018-07-10 | Discharge: 2018-07-10 | Disposition: A | Discharge status: Home / Self Care | Payer: 59 | Source: Ambulatory Visit | Attending: Gynecology | Admitting: Gynecology

## 2018-07-10 DIAGNOSIS — Z1231 Encounter for screening mammogram for malignant neoplasm of breast: Secondary | ICD-10-CM

## 2018-10-02 ENCOUNTER — Encounter: Payer: Self-pay | Admitting: Gynecology

## 2018-11-07 ENCOUNTER — Other Ambulatory Visit: Payer: Self-pay

## 2018-11-08 ENCOUNTER — Ambulatory Visit: Payer: 59 | Admitting: Gynecology

## 2018-11-08 ENCOUNTER — Encounter: Payer: Self-pay | Admitting: Gynecology

## 2018-11-08 VITALS — BP 118/74

## 2018-11-08 DIAGNOSIS — E7800 Pure hypercholesterolemia, unspecified: Secondary | ICD-10-CM

## 2018-11-08 DIAGNOSIS — N898 Other specified noninflammatory disorders of vagina: Secondary | ICD-10-CM

## 2018-11-08 LAB — WET PREP FOR TRICH, YEAST, CLUE

## 2018-11-08 MED ORDER — FLUCONAZOLE 150 MG PO TABS: 150.0000 mg | ORAL_TABLET | Freq: Once | ORAL | 0 refills | Status: AC

## 2018-11-08 NOTE — Patient Instructions (Signed)
Take the 1 Diflucan pill.  Follow-up if your symptoms continue.

## 2018-11-08 NOTE — Addendum Note (Signed)
Addended by: Anastasio Auerbach on: 11/08/2018 10:28 AM   Modules accepted: Orders

## 2018-11-08 NOTE — Progress Notes (Addendum)
    Sharnita Gatliff Feb 12, 1970 [id number]        48 y.o.  G4P4 presents with several day history of vaginal itching.  No real discharge.  No odor.  Used OTC antifungal cream last night and feels a little better this morning.  No urinary symptoms such as frequency dysuria urgency  Past medical history,surgical history, problem list, medications, allergies, family history and social history were all reviewed and documented in the EPIC chart.  Directed ROS with pertinent positives and negatives documented in the history of present illness/assessment and plan.  Exam: Caryn Bee assistant Vitals:   11/08/18 1008  BP: 118/74   General appearance:  Normal Abdomen soft nontender without mass guarding rebound Pelvic external BUS vagina with cream.  Cervix normal.  Uterus normal size midline mobile nontender.  Adnexa without masses or tenderness.  Assessment/Plan:  48 y.o. G4P4 with history and exam as above.  Wet prep shows cream as expected.  We will go ahead and treat with Diflucan 150 mg x 1 dose as it certainly sounds like she has a yeast infection.  She will follow-up if her symptoms persist.  Patient also is going to have a repeat fasting lipid profile today as her cholesterol and LDL were mildly elevated at her annual exam in May.    Anastasio Auerbach MD, 10:17 AM 11/08/2018

## 2018-11-09 LAB — LIPID PANEL
Cholesterol: 202 mg/dL — ABNORMAL HIGH (ref <200)
HDL: 64 mg/dL (ref >=50)
LDL Cholesterol (Calc): 123 mg/dL (calc) — ABNORMAL HIGH
Non-HDL Cholesterol (Calc): 138 mg/dL (calc) — ABNORMAL HIGH (ref <130)
Total CHOL/HDL Ratio: 3.2 (calc) (ref <5.0)
Triglycerides: 49 mg/dL (ref <150)

## 2018-11-11 ENCOUNTER — Encounter: Payer: Self-pay | Admitting: Gynecology

## 2019-03-03 NOTE — Progress Notes (Addendum)
[facility] Note  03/05/2019     CHIEF COMPLAINT Patient presents for Retina Evaluation   HISTORY OF PRESENT ILLNESS: [name] is a 49 y.o. female who presents to the clinic today for:   HPI    Retina Evaluation    In right eye.  Onset: Unknown.  Duration: Unknown.  Associated Symptoms Flashes, Floaters, Photophobia and Glare.  Context:  distance vision, mid-range vision and near vision.  Treatments tried include artificial tears.  Response to treatment was no improvement.  I, the attending physician,  performed the HPI with the patient and updated documentation appropriately.          Comments    49 y/o female pt referred by Dr. 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  Hx of ret tear OS.  Previous Dr. Zigmund Daniel pt.  Last seen by him 11/08/15.  VA OD has been getting gradually blurrier over the past 2-3 yrs, and pt has noticed periodic flashes and floaters OD over that same period of time as well.  Denies pain.  AT prn OU.       Last edited by Bernarda Caffey, MD on 03/07/2019  3:47 PM. (History)    Saw Dr. Marvel Plan 1 week ago and Dr. Marvel Plan referred patient for possible retinal tear OD. Patient states has noticed periodic flashes and floaters OD for the past 2-3 years, pt saw Dr. Zigmund Daniel and had a laser procedure in her left eye in 2007, her last exam with him was in 2017, pt states she has experienced a "small, tight, short coil" in her right eye that will spread out and eventually disappear(last incident was last year), she states she has a lot more floaters in her right eye recently    Referring physician: [facility] [address] [place],  [place] [id number]  HISTORICAL INFORMATION:   Selected notes from the [facility] Patient referred by Dr. Marvel Plan for possible retinal tear OD.  PMH: thyroid, myopia   CURRENT MEDICATIONS: No current outpatient medications on file. (Ophthalmic Drugs)   No  current facility-administered medications for this visit. (Ophthalmic Drugs)   Current Outpatient Medications (Other)  Medication Sig  . acetaminophen (TYLENOL) 500 MG tablet Take 500 mg by mouth every 6 (six) hours as needed.  Marland Kitchen ibuprofen (ADVIL,MOTRIN) 200 MG tablet Take 200 mg by mouth every 6 (six) hours as needed.  . NP THYROID 30 MG tablet Take 30 mg by mouth daily.   No current facility-administered medications for this visit. (Other)      REVIEW OF SYSTEMS: ROS    Positive for: Eyes   Negative for: Constitutional, Gastrointestinal, Neurological, Skin, Genitourinary, Musculoskeletal, HENT, Endocrine, Cardiovascular, Respiratory, Psychiatric, Allergic/Imm, Heme/Lymph   Last edited by Matthew Folks, COA on 03/05/2019  9:37 AM. (History)       ALLERGIES Allergies  Allergen Reactions  . Ciprofloxacin Other (See Comments)    Feet tingling    PAST MEDICAL HISTORY Past Medical History:  Diagnosis Date  . Cataract    NS OS   Past Surgical History:  Procedure Laterality Date  . HYMENECTOMY  1991  . VEIN SURGERY      FAMILY HISTORY Family History  Problem Relation Age of Onset  . Cancer Paternal Uncle        prostate  . Diabetes Paternal Grandfather   . Heart disease Paternal Grandfather        heart attack  . Stroke Father  mini stroke in his eye  . Hyperlipidemia Mother   . Hypertension Mother   . Other Paternal Aunt        phelibitis  . Mitral valve prolapse Brother   . Dementia Maternal Grandmother     SOCIAL HISTORY Social History   Tobacco Use  . Smoking status: Never Smoker  . Smokeless tobacco: Never Used  Substance Use Topics  . Alcohol use: Yes    Alcohol/week: 0.0 standard drinks    Comment: occassionally    . Drug use: No         OPHTHALMIC EXAM:  Base Eye Exam    Visual Acuity (Snellen - Linear)      Right Left   Dist cc 20/20 -2 20/15 -2   Correction: Contacts       Tonometry (Tonopen, 9:38 AM)      Right Left    Pressure 13 16       Pupils      Dark Light Shape React APD   Right 4 3 Round Brisk None   Left 4 3 Round Brisk None       Visual Fields (Counting fingers)      Left Right    Full Full       Extraocular Movement      Right Left    Full, Ortho Full, Ortho       Neuro/Psych    Oriented x3: Yes   Mood/Affect: Normal       Dilation    Both eyes: 1.0% Mydriacyl, 2.5% Phenylephrine @ 9:38 AM        Slit Lamp and Fundus Exam    Slit Lamp Exam      Right Left   Lids/Lashes Normal Normal   Conjunctiva/Sclera White and quiet White and quiet   Cornea trace Punctate epithelial erosions 2+ inferior Punctate epithelial erosions   Anterior Chamber Deep and quiet Deep and quiet   Iris Round and dilated Round and dilated   Lens 1+ Cortical cataract 1+ Cortical cataract   Vitreous Vitreous syneresis, no pigment, Posterior vitreous detachment, vitreous condensations Vitreous syneresis, no pigment       Fundus Exam      Right Left   Disc Pink and Sharp Pink and Sharp   C/D Ratio 0.3 0.4   Macula Flat, Blunted foveal reflex, mild Retinal pigment epithelial mottling, No heme or edema Flat, good foveal reflex, mild Retinal pigment epithelial mottling, No heme or edema   Vessels Mild Vascular attenuation, Tortuousity Mild Vascular attenuation, Tortuousity   Periphery Attached, pigmented paving stone degeneration inferiorly and temporally, blonde peripheral fundus; no RT/RD Attached, pigmented paving stone degeneration inferiorly and temporally, small HST at 0900 with excellent laser surrounding -- stable, blonde peripheral fundus        Refraction    Manifest Refraction      Sphere Cylinder Axis Dist VA   Right -7.75 +1.00 150 20/20-   Left -7.00 +0.75 030 20/15-          IMAGING AND PROCEDURES  Imaging and Procedures for '@TODAY'$ @  OCT, Retina - OU - Both Eyes       Right Eye Quality was good. Central Foveal Thickness: 289. Progression has no prior data. Findings include  normal foveal contour, no IRF, no SRF, myopic contour.   Left Eye Quality was good. Central Foveal Thickness: 289. Progression has no prior data. Findings include normal foveal contour, no IRF, no SRF, myopic contour, vitreomacular adhesion  (Focal vitreous traction superior  macula caught on widefield).   Notes *Images captured and stored on drive  Diagnosis / Impression:   OD: NFP, no IRF/SRF  OS: NFP, no IRF/SRF, +VMA, superior vitreous traction superior macula (caught on widefield)  Clinical management:  See below  Abbreviations: NFP - Normal foveal profile. CME - cystoid macular edema. PED - pigment epithelial detachment. IRF - intraretinal fluid. SRF - subretinal fluid. EZ - ellipsoid zone. ERM - epiretinal membrane. ORA - outer retinal atrophy. ORT - outer retinal tubulation. SRHM - subretinal hyper-reflective material                 ASSESSMENT/PLAN:    ICD-10-CM   1. Posterior vitreous detachment of both eyes  H43.813   2. Ocular migraine  G43.109   3. History of repair of retinal tear by laser photocoagulation  Z98.890   4. 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 Hx of ocular migraine  - pt reports 2 isolated prior episodes of coiled shapes in visual field OD with duration of several minutes; no headache symptoms associated  - self resolved  - no ophthalmic findings on dilated exam to explain epsiodes  - suspect ocular migraine episodes  - discussed findings, prognosis and potential treatment options via Neurology  - no intervention indicated at this time  - recommend  monitoring   3. Hx of retinal tear OS  - horseshoe tear at 0900 OS  - s/p laser retinopexy (Dr. Zigmund Daniel, 2007)  - excellent laser in place  - no new RT/RD OU  4. No retinal edema on exam or OCT  5. Pavingstone degeneration OU  6. Cortical Cataract OU  - The symptoms of cataract, surgical options, and treatments and risks were discussed with patient.  - discussed diagnosis and progression  - not yet visually significant  - monitor for now   Ophthalmic Meds Ordered this visit:  No orders of the defined types were placed in this encounter.      Return if symptoms worsen or fail to improve.  There are no Patient Instructions on file for this visit.   Explained the diagnoses, plan, and follow up with the patient and they expressed understanding.  Patient expressed understanding of the importance of proper follow up care.   This document serves as a record of services personally performed by Gardiner Sleeper, MD, PhD. It was created on their behalf by Roselee Nova, COMT. The creation of this record is the provider's dictation and/or activities during the visit.  Electronically signed by: Roselee Nova, COMT 03/07/19 4:08 PM   This document serves as a record of services personally performed by Gardiner Sleeper, MD, PhD. It was created on their behalf by Ernest Mallick, OA, an ophthalmic assistant. The creation of this record is the provider's dictation and/or activities during the visit.    Electronically signed by: Ernest Mallick, OA 02.24.2021 4:08 PM  Gardiner Sleeper, M.D., Ph.D. Diseases & Surgery of the Retina and Vitreous [facility]  I have reviewed  the above documentation for accuracy and completeness, and I agree with the above. Gardiner Sleeper, M.D., Ph.D. 03/07/19 4:08 PM    Abbreviations: M myopia (nearsighted); A astigmatism; H hyperopia (farsighted); P presbyopia; Mrx spectacle prescription;  CTL contact lenses; OD right eye; OS left eye; OU  both eyes  XT exotropia; ET esotropia; PEK punctate epithelial keratitis; PEE punctate epithelial erosions; DES dry eye syndrome; MGD meibomian gland dysfunction; ATs artificial tears; PFAT's preservative free artificial tears; [place] nuclear sclerotic cataract; PSC posterior subcapsular cataract; ERM epi-retinal membrane; PVD posterior vitreous detachment; RD retinal detachment; DM diabetes mellitus; DR diabetic retinopathy; NPDR non-proliferative diabetic retinopathy; PDR proliferative diabetic retinopathy; CSME clinically significant macular edema; DME diabetic macular edema; dbh dot blot hemorrhages; CWS cotton wool spot; POAG primary open angle glaucoma; C/D cup-to-disc ratio; HVF humphrey visual field; GVF goldmann visual field; OCT optical coherence tomography; IOP intraocular pressure; BRVO Branch retinal vein occlusion; CRVO central retinal vein occlusion; CRAO central retinal artery occlusion; BRAO branch retinal artery occlusion; RT retinal tear; SB scleral buckle; PPV pars plana vitrectomy; VH Vitreous hemorrhage; PRP panretinal laser photocoagulation; IVK intravitreal kenalog; VMT vitreomacular traction; MH Macular hole;  NVD neovascularization of the disc; NVE neovascularization elsewhere; AREDS age related eye disease study; ARMD age related macular degeneration; POAG primary open angle glaucoma; EBMD epithelial/anterior basement membrane dystrophy; ACIOL anterior chamber intraocular lens; IOL intraocular lens; PCIOL posterior chamber intraocular lens; Phaco/IOL phacoemulsification with intraocular lens placement; [place] photorefractive keratectomy; LASIK laser assisted in situ keratomileusis; HTN hypertension; DM diabetes mellitus; COPD chronic obstructive pulmonary disease

## 2019-03-05 ENCOUNTER — Encounter (INDEPENDENT_AMBULATORY_CARE_PROVIDER_SITE_OTHER): Payer: Self-pay | Admitting: Ophthalmology

## 2019-03-05 ENCOUNTER — Ambulatory Visit (INDEPENDENT_AMBULATORY_CARE_PROVIDER_SITE_OTHER): Payer: Managed Care, Other (non HMO) | Admitting: Ophthalmology

## 2019-03-05 DIAGNOSIS — H43813 Vitreous degeneration, bilateral: Secondary | ICD-10-CM

## 2019-03-05 DIAGNOSIS — Z9889 Other specified postprocedural states: Secondary | ICD-10-CM | POA: Diagnosis not present

## 2019-03-05 DIAGNOSIS — H35433 Paving stone degeneration of retina, bilateral: Secondary | ICD-10-CM

## 2019-03-05 DIAGNOSIS — H3581 Retinal edema: Secondary | ICD-10-CM

## 2019-03-05 DIAGNOSIS — G43109 Migraine with aura, not intractable, without status migrainosus: Secondary | ICD-10-CM

## 2019-03-05 DIAGNOSIS — H269 Unspecified cataract: Secondary | ICD-10-CM

## 2019-03-07 ENCOUNTER — Encounter (INDEPENDENT_AMBULATORY_CARE_PROVIDER_SITE_OTHER): Payer: Self-pay | Admitting: Ophthalmology

## 2019-08-21 ENCOUNTER — Other Ambulatory Visit: Payer: Self-pay | Admitting: Obstetrics and Gynecology

## 2019-08-21 DIAGNOSIS — Z1231 Encounter for screening mammogram for malignant neoplasm of breast: Secondary | ICD-10-CM

## 2019-09-22 ENCOUNTER — Other Ambulatory Visit: Payer: Self-pay

## 2019-09-22 ENCOUNTER — Ambulatory Visit: Payer: 59 | Admitting: Obstetrics and Gynecology

## 2019-09-22 ENCOUNTER — Encounter: Payer: Self-pay | Admitting: Obstetrics and Gynecology

## 2019-09-22 VITALS — BP 116/74 | Ht 65.0 in | Wt 141.0 lb

## 2019-09-22 DIAGNOSIS — Z01419 Encounter for gynecological examination (general) (routine) without abnormal findings: Secondary | ICD-10-CM | POA: Diagnosis not present

## 2019-09-22 NOTE — Progress Notes (Signed)
   [name] Oct 31, 1970 [id number]  SUBJECTIVE:  49 y.o. G48P4 female for annual routine gynecologic exam. She has no gynecologic concerns.  Normal monthly periods, sometimes off by a day or two but overall pretty regular.  No excessive bleeding.  No hot flashes or night sweats.   Current Outpatient Medications  Medication Sig Dispense Refill  . acetaminophen (TYLENOL) 500 MG tablet Take 500 mg by mouth every 6 (six) hours as needed.    Marland Kitchen ibuprofen (ADVIL,MOTRIN) 200 MG tablet Take 200 mg by mouth every 6 (six) hours as needed.    . NONFORMULARY OR COMPOUNDED ITEM Testosterone 2% cream    . NP THYROID 30 MG tablet Take 30 mg by mouth daily.    Marland Kitchen UNABLE TO FIND Thyroid 15 mg     No current facility-administered medications for this visit.   Allergies: Ciprofloxacin  Patient's last menstrual period was 09/10/2019.  Past medical history,surgical history, problem list, medications, allergies, family history and social history were all reviewed and documented as reviewed in the EPIC chart.  ROS: Pertinent positives and negatives as outlined in HPI.   OBJECTIVE:  BP 116/74   Ht 5\' 5"  (1.651 m)   Wt 141 lb (64 kg)   LMP 09/10/2019   BMI 23.46 kg/m  The patient appears well, alert, oriented x 3, in no distress. ENT normal.  Abdomen soft without tenderness, guarding, mass or organomegaly.  Neurological is normal, no focal findings.  BREAST EXAM: breasts appear normal, no suspicious masses, no skin or nipple changes or axillary nodes  PELVIC EXAM: VULVA: normal appearing vulva with no masses, tenderness or lesions, VAGINA: normal appearing vagina with normal color and discharge, no lesions, CERVIX: normal appearing cervix without discharge or lesions, UTERUS: uterus is normal size, shape, consistency and nontender, ADNEXA: normal adnexa in size, nontender and no masses. RECTAL: Normal tone, no masses  Chaperone: 11/10/2019 present during the examination  ASSESSMENT:  49 y.o.  G4P4 here for annual gynecologic exam  PLAN:   1. No hormonal or menstrual concerns. 2. Pap smear/HPV 05/2018.  Benign endocervical polyp removed last year.  No significant history of abnormal Pap smears.  Next Pap smear due 2025 following the current guidelines recommending the 5 year interval. 3.  We discussed that colonoscopy is recommended for routine colon cancer screening at age 49.  Absence of any personal or family history of GI cancers, she could be a candidate for Cologuard screening, some of the potential downfalls of that are discussed.  She will think about her options and let 44 know how she wants to proceed. 4. Mammogram scheduled 09/24/2019.  Breast exam normal.  5. Health maintenance.  No labs today as she indicates she had these recently done elsewhere.  She has started on thyroid medication in the interim and that is managed by integr[facility].  Return annually or sooner, prn.  09/26/2019 MD 09/22/19

## 2019-09-24 ENCOUNTER — Other Ambulatory Visit: Payer: Self-pay

## 2019-09-24 ENCOUNTER — Ambulatory Visit
Admission: RE | Admit: 2019-09-24 | Discharge: 2019-09-24 | Disposition: A | Discharge status: Home / Self Care | Payer: 59 | Source: Ambulatory Visit | Attending: Obstetrics and Gynecology | Admitting: Obstetrics and Gynecology

## 2019-09-24 DIAGNOSIS — Z1231 Encounter for screening mammogram for malignant neoplasm of breast: Secondary | ICD-10-CM

## 2020-12-07 ENCOUNTER — Other Ambulatory Visit: Payer: Self-pay | Admitting: Nurse Practitioner

## 2020-12-07 DIAGNOSIS — Z1231 Encounter for screening mammogram for malignant neoplasm of breast: Secondary | ICD-10-CM

## 2021-01-11 ENCOUNTER — Ambulatory Visit
Admission: RE | Admit: 2021-01-11 | Discharge: 2021-01-11 | Disposition: A | Discharge status: Home / Self Care | Payer: 59 | Source: Ambulatory Visit | Attending: Nurse Practitioner | Admitting: Nurse Practitioner

## 2021-01-11 DIAGNOSIS — Z1231 Encounter for screening mammogram for malignant neoplasm of breast: Secondary | ICD-10-CM

## 2021-01-25 NOTE — Progress Notes (Signed)
° °  [name] 02/24/1970 [id number]   History:  51 y.o. G4P4 presents for annual exam. Cycles have started to become irregular, sometimes skipping a month. Denies menopausal symptoms. Normal pap history. Hypothyroidism managed by [facility].   Gynecologic History Patient's last menstrual period was 01/19/2021. Period Cycle (Days): 28 Period Duration (Days): 7 Period Pattern: (!) Irregular Menstrual Flow: Moderate, Heavy Dysmenorrhea: (!) Mild Dysmenorrhea Symptoms: Cramping Contraception/Family planning: none Sexually active: Yes  Health Maintenance Last Pap: 05/30/2018. Results were: Normal, 5-year repeat Last mammogram: 01/11/2021. Results were: Normal Last colonoscopy: Never Last Dexa: Not indicated  Past medical history, past surgical history, family history and social history were all reviewed and documented in the EPIC chart. Married. 4 daughters. Homeschooling 3 of them now.   ROS:  A ROS was performed and pertinent positives and negatives are included.  Exam:  Vitals:   01/26/21 0828  BP: 120/74  Weight: 142 lb (64.4 kg)  Height: 5' 5.75" (1.67 m)   Body mass index is 23.09 kg/m.  General appearance:  Normal Thyroid:  Symmetrical, normal in size, without palpable masses or nodularity. Respiratory  Auscultation:  Clear without wheezing or rhonchi Cardiovascular  Auscultation:  Regular rate, without rubs, murmurs or gallops  Edema/varicosities:  Not grossly evident Abdominal  Soft,nontender, without masses, guarding or rebound.  Liver/spleen:  No organomegaly noted  Hernia:  None appreciated  Skin  Inspection:  Grossly normal Breasts: Examined lying and sitting.   Right: Without masses, retractions, nipple discharge or axillary adenopathy.   Left: Without masses, retractions, nipple discharge or axillary adenopathy. Genitourinary   Inguinal/mons:  Normal without inguinal adenopathy  External genitalia:  Normal appearing vulva with no  masses, tenderness, or lesions  BUS/Urethra/Skene's glands:  Normal  Vagina:  Normal appearing with normal color and discharge, no lesions. Light menstrual bleeding  Cervix:  Normal appearing without discharge or lesions  Uterus:  Normal in size, shape and contour.  Midline and mobile, nontender  Adnexa/parametria:     Rt: Normal in size, without masses or tenderness.   Lt: Normal in size, without masses or tenderness.  Anus and perineum: Normal  Digital rectal exam: Normal sphincter tone without palpated masses or tenderness  Patient informed chaperone available to be present for breast and pelvic exam. Patient has requested no chaperone to be present. Patient has been advised what will be completed during breast and pelvic exam.   Assessment/Plan:  51 y.o. G4P4 for annual exam.   Well female exam with routine gynecological exam - Education provided on SBEs, importance of preventative screenings, current guidelines, high calcium diet, regular exercise, and multivitamin daily.  Labs done at [facility].   Perimenopause - Cycles have started to become irregular, sometimes skipping a month. Denies menopausal symptoms. Discussed what to expect during transition.   Screening for cervical cancer - Normal Pap history.  Will repeat at 5-year interval per guidelines.  Screening for breast cancer - Normal mammogram history.  Continue annual screenings.  Normal breast exam today.  Screening for colon cancer - Has not had screening colonoscopy. Discussed current guidelines and importance of preventative screenings. Information provided in [facility].   Return in 1 year for annual.     Olivia Mackie DNP, 8:40 AM 01/26/2021

## 2021-01-26 ENCOUNTER — Ambulatory Visit (INDEPENDENT_AMBULATORY_CARE_PROVIDER_SITE_OTHER): Payer: 59 | Admitting: Nurse Practitioner

## 2021-01-26 ENCOUNTER — Other Ambulatory Visit: Payer: Self-pay

## 2021-01-26 ENCOUNTER — Encounter: Payer: Self-pay | Admitting: Nurse Practitioner

## 2021-01-26 VITALS — BP 120/74 | Ht 65.75 in | Wt 142.0 lb

## 2021-01-26 DIAGNOSIS — Z01419 Encounter for gynecological examination (general) (routine) without abnormal findings: Secondary | ICD-10-CM

## 2021-01-26 DIAGNOSIS — N951 Menopausal and female climacteric states: Secondary | ICD-10-CM

## 2021-01-26 NOTE — Patient Instructions (Signed)
Schedule Colonoscopy! ? GI ?[phone] ?[address] [place], [place] [address] ? ?

## 2021-12-09 ENCOUNTER — Other Ambulatory Visit: Payer: Self-pay | Admitting: Nurse Practitioner

## 2021-12-09 DIAGNOSIS — Z1231 Encounter for screening mammogram for malignant neoplasm of breast: Secondary | ICD-10-CM

## 2022-02-02 ENCOUNTER — Ambulatory Visit
Admission: RE | Admit: 2022-02-02 | Discharge: 2022-02-02 | Disposition: A | Discharge status: Home / Self Care | Payer: 59 | Source: Ambulatory Visit | Attending: Nurse Practitioner | Admitting: Nurse Practitioner

## 2022-02-02 DIAGNOSIS — Z1231 Encounter for screening mammogram for malignant neoplasm of breast: Secondary | ICD-10-CM

## 2022-05-28 IMAGING — MG MM DIGITAL SCREENING BILAT W/ TOMO AND CAD
8 series · 9 of 24 positions shown · non-contrast
Comparison: Previous exam(s).

CLINICAL DATA: Screening.

EXAM:
DIGITAL SCREENING BILATERAL MAMMOGRAM WITH TOMOSYNTHESIS AND CAD
TECHNIQUE: Bilateral screening digital craniocaudal and mediolateral oblique
mammograms were obtained. Bilateral screening digital breast
tomosynthesis was performed. The images were evaluated with
computer-aided detection.

[R MLO synth-2D]
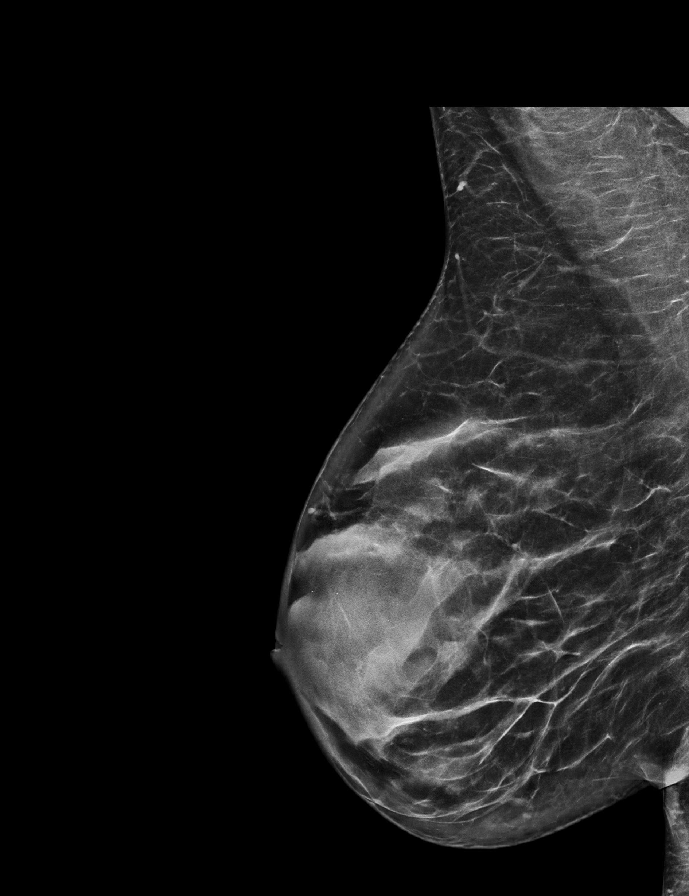

[R CC synth-2D]
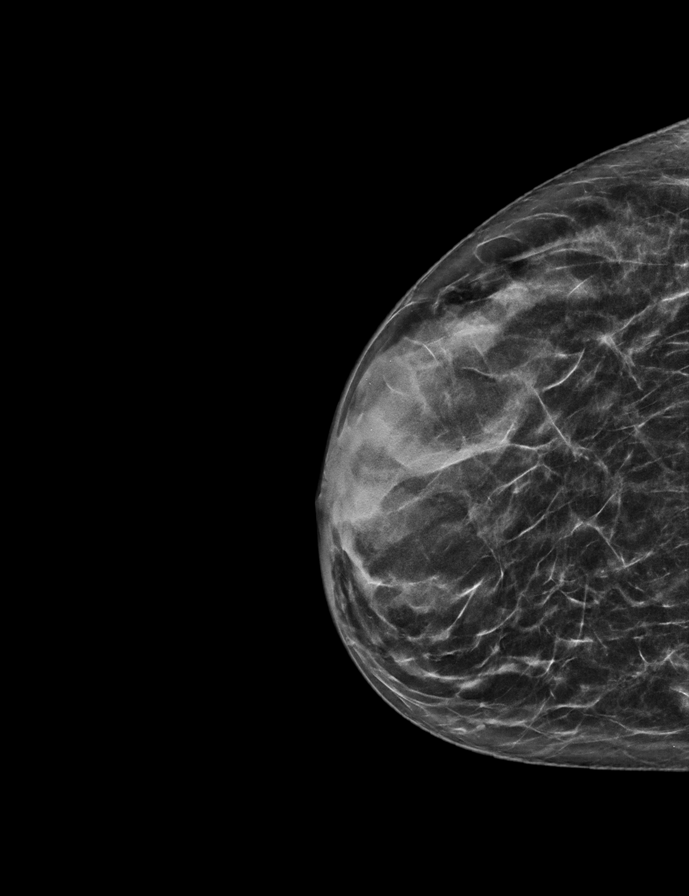

[L MLO synth-2D]
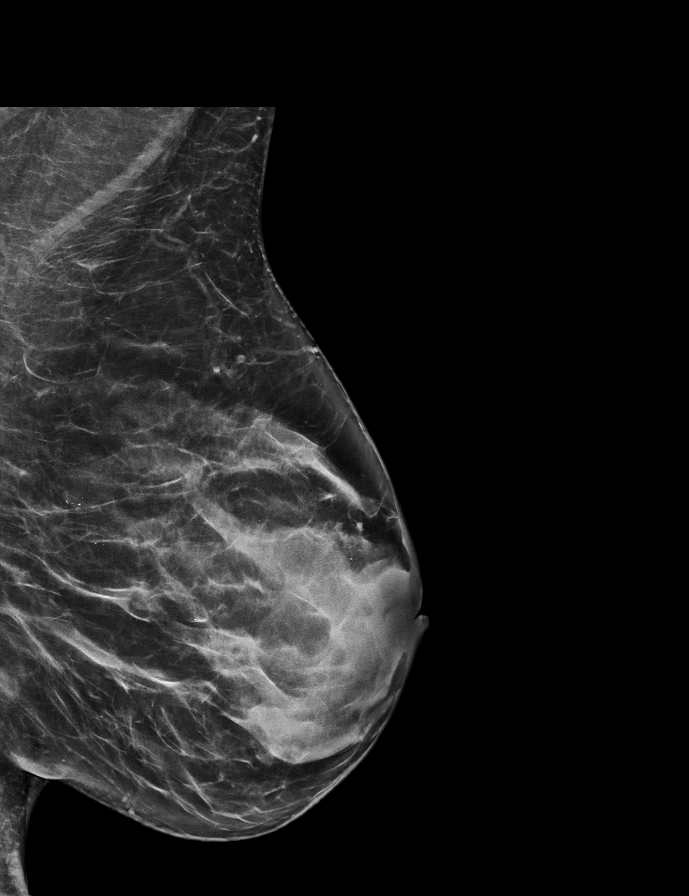

[L CC synth-2D]
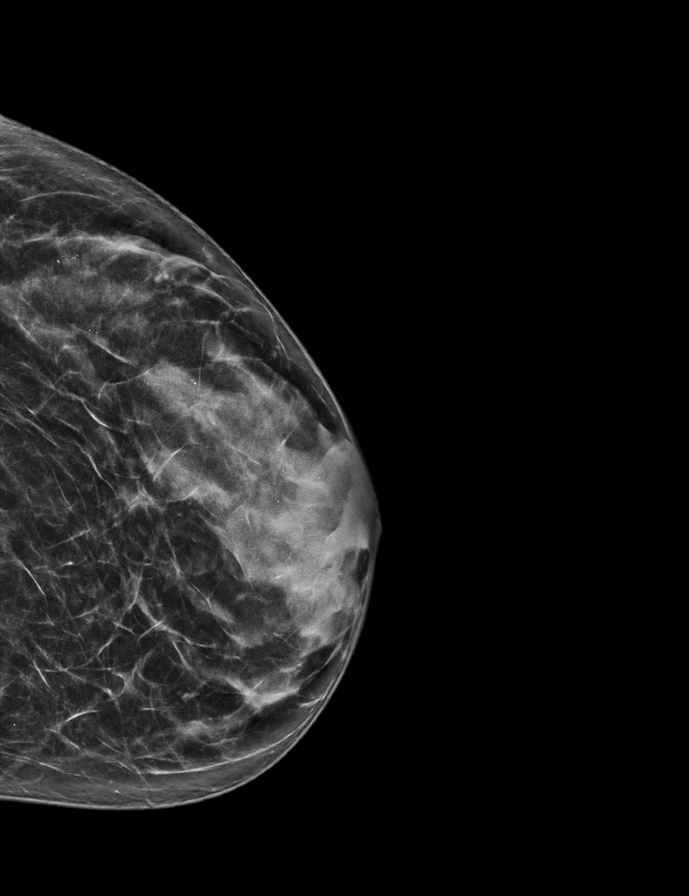

[R MLO tomo · 2 of 66 frames shown]
[frame 22/66]
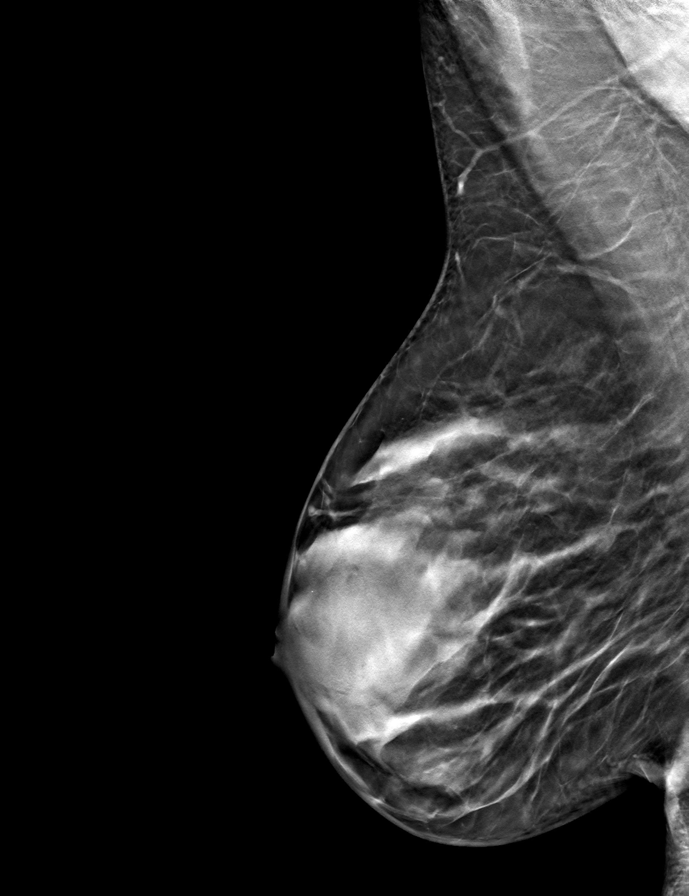
[frame 33/66]
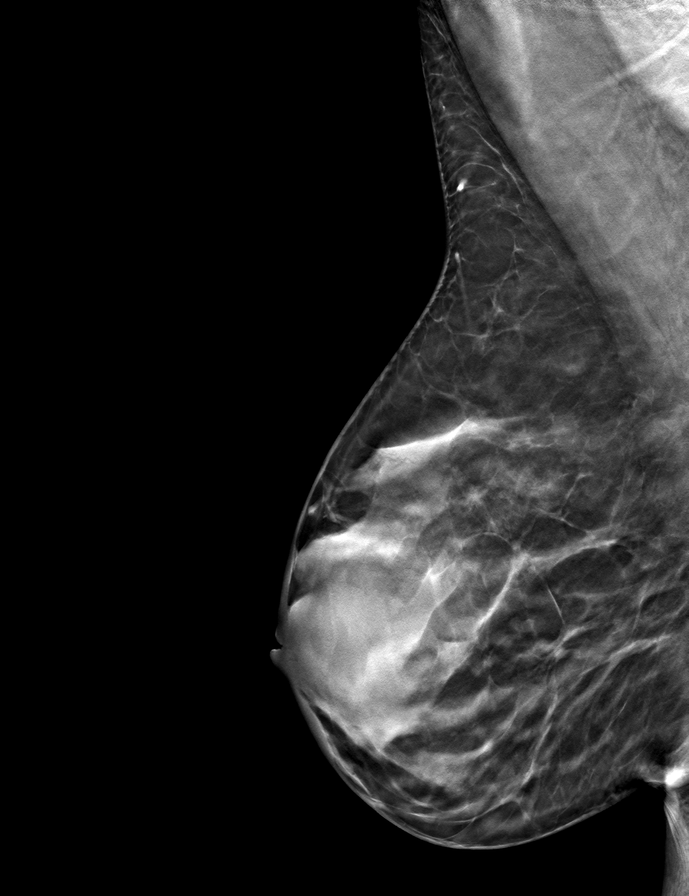

[R CC tomo · tomo slice 29/57.0]
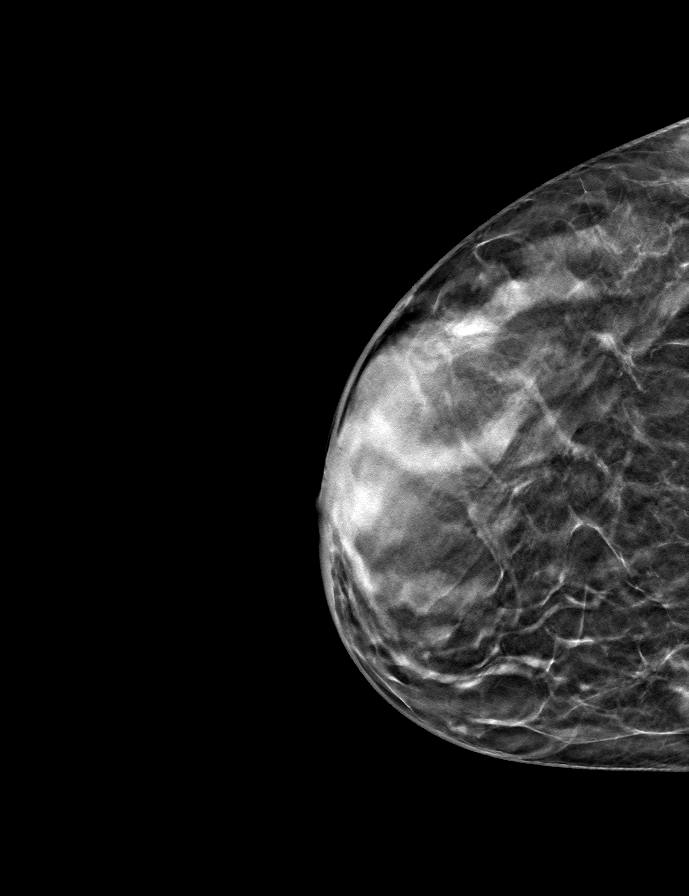

[L MLO tomo · tomo slice 35/69.0]
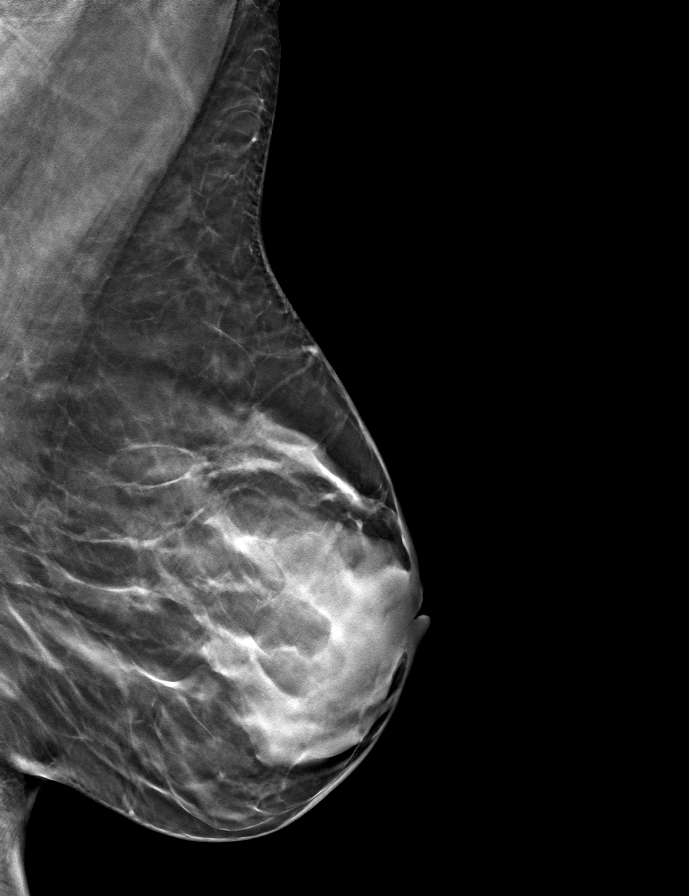

[L CC tomo · tomo slice 31/62.0]
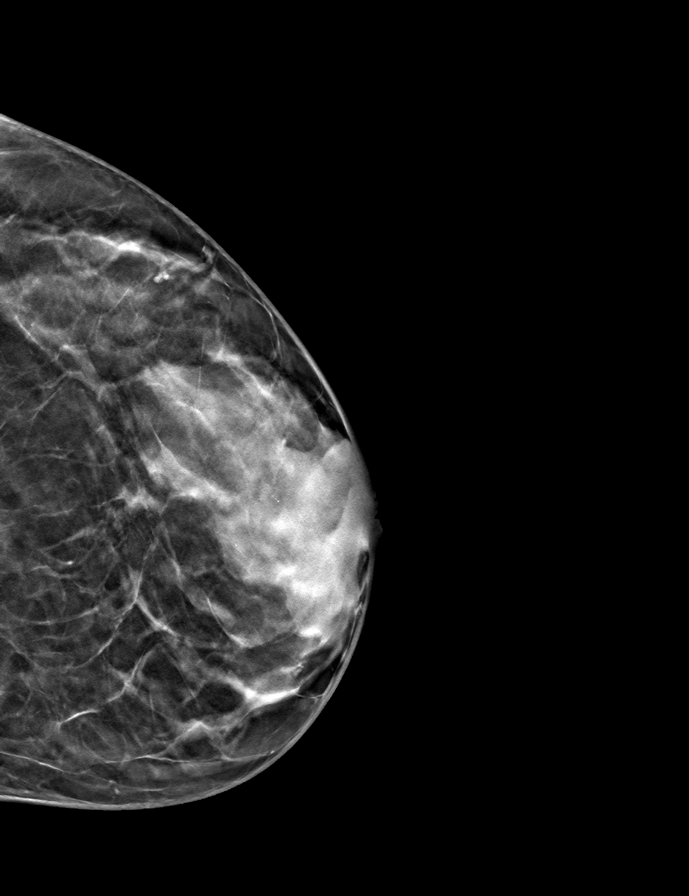

[9 of 24 positions shown; findings below may reference images not displayed]

ACR Breast Density Category c: The breast tissue is heterogeneously
dense, which may obscure small masses.
FINDINGS: There are no findings suspicious for malignancy.
IMPRESSION: No mammographic evidence of malignancy. A result letter of this
screening mammogram will be mailed directly to the patient.

RECOMMENDATION:
Screening mammogram in one year. (Code:Q3-W-BC3)

BI-RADS CATEGORY  1: Negative.

## 2023-03-02 ENCOUNTER — Other Ambulatory Visit: Payer: Self-pay | Admitting: Nurse Practitioner

## 2023-03-02 DIAGNOSIS — Z1231 Encounter for screening mammogram for malignant neoplasm of breast: Secondary | ICD-10-CM

## 2023-03-09 ENCOUNTER — Ambulatory Visit
Admission: RE | Admit: 2023-03-09 | Discharge: 2023-03-09 | Disposition: A | Discharge status: Home / Self Care | Payer: 59 | Source: Ambulatory Visit | Attending: Nurse Practitioner | Admitting: Nurse Practitioner

## 2023-03-09 DIAGNOSIS — Z1231 Encounter for screening mammogram for malignant neoplasm of breast: Secondary | ICD-10-CM

## 2023-04-26 ENCOUNTER — Encounter: Payer: Self-pay | Admitting: Nurse Practitioner

## 2023-04-26 ENCOUNTER — Ambulatory Visit (INDEPENDENT_AMBULATORY_CARE_PROVIDER_SITE_OTHER): Payer: 59 | Admitting: Nurse Practitioner

## 2023-04-26 ENCOUNTER — Other Ambulatory Visit (HOSPITAL_COMMUNITY)
Admission: RE | Admit: 2023-04-26 | Discharge: 2023-04-26 | Disposition: A | Discharge status: Home / Self Care | Source: Ambulatory Visit | Attending: Nurse Practitioner | Admitting: Nurse Practitioner

## 2023-04-26 VITALS — BP 102/62 | HR 76 | Ht 65.5 in | Wt 149.0 lb

## 2023-04-26 DIAGNOSIS — Z1331 Encounter for screening for depression: Secondary | ICD-10-CM | POA: Diagnosis not present

## 2023-04-26 DIAGNOSIS — Z124 Encounter for screening for malignant neoplasm of cervix: Secondary | ICD-10-CM | POA: Diagnosis present

## 2023-04-26 DIAGNOSIS — N951 Menopausal and female climacteric states: Secondary | ICD-10-CM | POA: Diagnosis not present

## 2023-04-26 DIAGNOSIS — Z01419 Encounter for gynecological examination (general) (routine) without abnormal findings: Secondary | ICD-10-CM | POA: Diagnosis not present

## 2023-04-26 DIAGNOSIS — Z1211 Encounter for screening for malignant neoplasm of colon: Secondary | ICD-10-CM

## 2023-04-26 NOTE — Progress Notes (Signed)
 [name] [birth year] [id number]   History:  53 y.o. G4P4 presents for annual exam. Menses every 3-4 months. Started HRT in January for vasomotor symptoms, prescribed by [facility]. Doing sublingual progesterone and estrogen, and testosterone gel. Normal pap history. Hypothyroidism managed by [facility].  Gynecologic History Patient's last menstrual period was 02/01/2023 (exact date).   Contraception/Family planning: none Sexually active: Yes  Health Maintenance Last Pap: 05/30/2018. Results were: Normal neg HPV Last mammogram: 03/09/2023. Results were: Normal Last colonoscopy: Never Last Dexa: Not indicated  Flowsheet Row Office Visit from 04/26/2023 in [facility]  PHQ-2 Total Score 0        Past medical history, past surgical history, family history and social history were all reviewed and documented in the EPIC chart. Married. 4 daughters. Oldest in college in [place]. Home schooling.  ROS:  A ROS was performed and pertinent positives and negatives are included.  Exam:  Vitals:   04/26/23 1544  BP: 102/62  Pulse: 76  SpO2: 99%  Weight: 149 lb (67.6 kg)  Height: 5' 5.5" (1.664 m)    Body mass index is 24.42 kg/m.  General appearance:  Normal Thyroid:  Symmetrical, normal in size, without palpable masses or nodularity. Respiratory  Auscultation:  Clear without wheezing or rhonchi Cardiovascular  Auscultation:  Regular rate, without rubs, murmurs or gallops  Edema/varicosities:  Not grossly evident Abdominal  Soft,nontender, without masses, guarding or rebound.  Liver/spleen:  No organomegaly noted  Hernia:  None appreciated  Skin  Inspection:  Grossly normal Breasts: Examined lying and sitting.   Right: Without masses, retractions, nipple discharge or axillary adenopathy.   Left: Without masses, retractions, nipple discharge or axillary adenopathy. Pelvic: External genitalia:  no lesions              Urethra:  normal appearing  urethra with no masses, tenderness or lesions              Bartholins and Skenes: normal                 Vagina: normal appearing vagina with normal color and discharge, no lesions              Cervix: no lesions Bimanual Exam:  Uterus:  no masses or tenderness              Adnexa: no mass, fullness, tenderness              Rectovaginal: Deferred              Anus:  normal, no lesions  Patient informed chaperone available to be present for breast and pelvic exam. Patient has requested no chaperone to be present. Patient has been advised what will be completed during breast and pelvic exam.   Assessment/Plan:  53 y.o. G4P4 for annual exam.   Well female exam with routine gynecological exam - Education provided on SBEs, importance of preventative screenings, current guidelines, high calcium diet, regular exercise, and multivitamin daily.  Labs done at [facility].   Perimenopause - Cycles every 3-4 months. Started HRT in January for vasomotor symptoms, prescribed by [facility]. Doing sublingual progesterone and estrogen, and testosterone gel.  Cervical cancer screening - Plan: Cytology - PAP( [facility]). Normal pap history.   Screening for colon cancer - Plan: Cologuard.Average risk. Cologuard versus Colonoscopy discussed.   Screening for breast cancer - Normal mammogram history.  Continue annual screenings.  Normal breast exam today.  Return in about 1 year (  around 04/25/2024) for Annual.    [name] DNP, 4:17 PM 04/26/2023

## 2023-05-02 LAB — CYTOLOGY - PAP
Comment: NEGATIVE
Diagnosis: UNDETERMINED — AB
High risk HPV: NEGATIVE

## 2023-05-07 ENCOUNTER — Encounter: Payer: Self-pay | Admitting: Nurse Practitioner

## 2023-05-10 LAB — COLOGUARD: COLOGUARD: NEGATIVE

## 2023-05-14 ENCOUNTER — Encounter: Payer: Self-pay | Admitting: Nurse Practitioner
